# Patient Record
Sex: Male | Born: 1975 | Race: White | Hispanic: No | Marital: Single | Smoking: Never smoker
Health system: Southern US, Community
[De-identification: ages and names within clinical notes are randomized; demographics above are authoritative.]

## PROBLEM LIST (undated history)

## (undated) DIAGNOSIS — I10 Essential (primary) hypertension: Secondary | ICD-10-CM

## (undated) DIAGNOSIS — J45909 Unspecified asthma, uncomplicated: Secondary | ICD-10-CM

## (undated) DIAGNOSIS — R569 Unspecified convulsions: Secondary | ICD-10-CM

## (undated) HISTORY — DX: Essential (primary) hypertension: I10

## (undated) HISTORY — DX: Unspecified convulsions: R56.9

## (undated) HISTORY — PX: DENTAL SURGERY: SHX609

## (undated) HISTORY — DX: Unspecified asthma, uncomplicated: J45.909

---

## 2003-02-13 ENCOUNTER — Ambulatory Visit (HOSPITAL_COMMUNITY): Admission: RE | Admit: 2003-02-13 | Discharge: 2003-02-13 | Payer: Self-pay | Admitting: Neurology

## 2013-10-04 ENCOUNTER — Other Ambulatory Visit: Payer: Self-pay | Admitting: Family Medicine

## 2013-10-04 DIAGNOSIS — N50812 Left testicular pain: Secondary | ICD-10-CM

## 2013-10-04 DIAGNOSIS — N5089 Other specified disorders of the male genital organs: Secondary | ICD-10-CM

## 2013-10-08 ENCOUNTER — Other Ambulatory Visit: Payer: Self-pay

## 2013-10-11 ENCOUNTER — Ambulatory Visit
Admission: RE | Admit: 2013-10-11 | Discharge: 2013-10-11 | Disposition: A | Discharge status: Home / Self Care | Payer: BC Managed Care – PPO | Source: Ambulatory Visit | Attending: Family Medicine | Admitting: Family Medicine

## 2013-10-11 DIAGNOSIS — N50812 Left testicular pain: Secondary | ICD-10-CM

## 2013-10-11 DIAGNOSIS — N5089 Other specified disorders of the male genital organs: Secondary | ICD-10-CM

## 2015-05-07 ENCOUNTER — Other Ambulatory Visit: Payer: Self-pay | Admitting: Family Medicine

## 2015-05-07 DIAGNOSIS — Z139 Encounter for screening, unspecified: Secondary | ICD-10-CM

## 2015-05-07 DIAGNOSIS — R569 Unspecified convulsions: Secondary | ICD-10-CM

## 2015-05-08 ENCOUNTER — Encounter: Payer: Self-pay | Admitting: Neurology

## 2015-05-08 ENCOUNTER — Ambulatory Visit (INDEPENDENT_AMBULATORY_CARE_PROVIDER_SITE_OTHER): Payer: BLUE CROSS/BLUE SHIELD | Admitting: Neurology

## 2015-05-08 VITALS — BP 151/89 | HR 60 | Ht 69.0 in | Wt 190.6 lb

## 2015-05-08 DIAGNOSIS — G40009 Localization-related (focal) (partial) idiopathic epilepsy and epileptic syndromes with seizures of localized onset, not intractable, without status epilepticus: Secondary | ICD-10-CM

## 2015-05-08 DIAGNOSIS — R29818 Other symptoms and signs involving the nervous system: Secondary | ICD-10-CM | POA: Diagnosis not present

## 2015-05-08 DIAGNOSIS — G40909 Epilepsy, unspecified, not intractable, without status epilepticus: Secondary | ICD-10-CM | POA: Insufficient documentation

## 2015-05-08 NOTE — Progress Notes (Signed)
[facility] [address] [place]. [place] [address] [phone]       OFFICE CONSULT NOTE  Mr. Xsavier Seeley Date of Birth:  07-18-1976 Medical Record Number:  [id number]   Referring MD:  Bing Matter, PA-C  Reason for Referral:   seizure  HPI: Mr Cupp is a 39 year old male who had 2 recent episodes of possible seizures on August 29 and 31st. He states that he woke up on the floor the middle of the night and had to crawl back into his bed. He felt foggy and tired. He denied any injury, tongue bite or incontinence. The next day he had kind of like a hangover feeling. Marland Kitchen He states he has had similar episodes of possible seizures in February, March as well as April. He did have once seizure 12 years ago in 2004 and is on Dr. Jannifer Franklin. He remembers having had an MRI scan of the brain and EEG but I did not have any of these results. He was started on phenytoin which he took for a year and stopped. He apparently did not keep follow-up. Patient was seen recently in the ER for this 2 episodes and started back on Dilantin. On inquiry he also admits to transient episodes of having dj vu-like phenomena with some same dream lasting several minutes to half an hour occurring off and on for the last 1 year. Denies any significant head injury with loss of consciousness, prior history of this strokes, TIAs or history of childhood epilepsy. He's been taking Dilantin for the last 3 days and is tolerating it well without any side effects.  ROS:   14 system review of systems is positive for memory loss, seizure, allergies  PMH:  Past Medical History  Diagnosis Date  . Hypertension   . Seizures     Social History:  Social History   Social History  . Marital Status: Single    Spouse Name: N/A  . Number of Children: N/A  . Years of Education: 12   Occupational History  . [employer]     Social History Main Topics  . Smoking status: Never Smoker   . Smokeless  tobacco: Not on file  . Alcohol Use: No  . Drug Use: Yes    Special: Marijuana  . Sexual Activity: No   Other Topics Concern  . Not on file   Social History Narrative   Lives alone in a home    Drinks Coffee (1 pot) a day     Medications:   No current outpatient prescriptions on file prior to visit.   No current facility-administered medications on file prior to visit.    Allergies:   Allergies  Allergen Reactions  . Acetaminophen   . Levofloxacin   . Penicillins     Physical Exam General: well developed, well nourished young male, seated, in no evident distress Head: head normocephalic and atraumatic.   Neck: supple with no carotid or supraclavicular bruits Cardiovascular: regular rate and rhythm, no murmurs Musculoskeletal: no deformity Skin:  no rash/petichiae Vascular:  Normal pulses all extremities  Neurologic Exam Mental Status: Awake and fully alert. Oriented to place and time. Recent and remote memory intact. Attention span, concentration and fund of knowledge appropriate. Mood and affect appropriate.  Cranial Nerves: Fundoscopic exam reveals sharp disc margins. Pupils equal, briskly reactive to light. Extraocular movements full without nystagmus. Visual fields full to confrontation. Hearing intact. Facial sensation intact. Face, tongue, palate moves normally and symmetrically.  Motor: Normal bulk  and tone. Normal strength in all tested extremity muscles. Sensory.: intact to touch , pinprick , position and vibratory sensation.  Coordination: Rapid alternating movements normal in all extremities. Finger-to-nose and heel-to-shin performed accurately bilaterally. Gait and Station: Arises from chair without difficulty. Stance is normal. Gait demonstrates normal stride length and balance . Able to heel, toe and tandem walk without difficulty.  Reflexes: 1+ and symmetric. Toes downgoing.       ASSESSMENT: 20 year young male with 2 episodes of seizures in the last 1  week. He has remote history of seizures  In 2004 but had stopped anticonvulsants after a year.    PLAN: I had a long discussion with the patient with regards to his recent episodes of seizures which likely represent epilepsy given his remote history of seizures as well. I agree with continuing phenytoin 300 mg at night but check level in one week. Check EEG as well. I advised the patient to keep his appointment for MRI scan of the brain which is scheduled for 05/13/15. He was asked not to drive for the next 6 months as per [facility]. He was also asked to avoid seizure provoking eating stimuli like sleep deprivation, irregular eating or sleeping habits, alcohol or street drugs. He will return for follow-up in 2 months or call earlier if necessary. Antony Contras, MD Note: This document was prepared with digital dictation and possible smart phrase technology. Any transcriptional errors that result from this process are unintentional.

## 2015-05-08 NOTE — Patient Instructions (Signed)
I had a long discussion with the patient with regards to his recent episodes of seizures which likely represent epilepsy given his remote history of seizures as well. I agree with continuing phenytoin 300 mg at night but check level in one week. Check EEG as well. I advised the patient to keep his appointment for MRI scan of the brain which is scheduled for 05/13/15. He was asked not to drive for the next 6 months as per [facility]. He was also asked to avoid seizure provoking eating stimuli like sleep deprivation, irregular eating or sleeping habits, alcohol or street drugs. He will return for follow-up in 2 months or call earlier if necessary. Epilepsy Epilepsy is a disorder in which a person has repeated seizures over time. A seizure is a release of abnormal electrical activity in the brain. Seizures can cause a change in attention, behavior, or the ability to remain awake and alert (altered mental status). Seizures often involve uncontrollable shaking (convulsions).  Most people with epilepsy lead normal lives. However, people with epilepsy are at an increased risk of falls, accidents, and injuries. Therefore, it is important to begin treatment right away. CAUSES  Epilepsy has many possible causes. Anything that disturbs the normal pattern of brain cell activity can lead to seizures. This may include:   Head injury.  Birth trauma.  High fever as a child.  Stroke.  Bleeding into or around the brain.  Certain drugs.  Prolonged low oxygen, such as what occurs after CPR efforts.  Abnormal brain development.  Certain illnesses, such as meningitis, encephalitis (brain infection), malaria, and other infections.  An imbalance of nerve signaling chemicals (neurotransmitters).  SIGNS AND SYMPTOMS  The symptoms of a seizure can vary greatly from one person to another. Right before a seizure, you may have a warning (aura) that a seizure is about to occur. An aura may include the following  symptoms:  Fear or anxiety.  Nausea.  Feeling like the room is spinning (vertigo).  Vision changes, such as seeing flashing lights or spots. Common symptoms during a seizure include:  Abnormal sensations, such as an abnormal smell or a bitter taste in the mouth.   Sudden, general body stiffness.   Convulsions that involve rhythmic jerking of the face, arm, or leg on one or both sides.   Sudden change in consciousness.   Appearing to be awake but not responding.   Appearing to be asleep but cannot be awakened.   Grimacing, chewing, lip smacking, drooling, tongue biting, or loss of bowel or bladder control. After a seizure, you may feel sleepy for a while. DIAGNOSIS  Your health care provider will ask about your symptoms and take a medical history. Descriptions from any witnesses to your seizures will be very helpful in the diagnosis. A physical exam, including a detailed neurological exam, is necessary. Various tests may be done, such as:   An electroencephalogram (EEG). This is a painless test of your brain waves. In this test, a diagram is created of your brain waves. These diagrams can be interpreted by a specialist.  An MRI of the brain.   A CT scan of the brain.   A spinal tap (lumbar puncture, LP).  Blood tests to check for signs of infection or abnormal blood chemistry. TREATMENT  There is no cure for epilepsy, but it is generally treatable. Once epilepsy is diagnosed, it is important to begin treatment as soon as possible. For most people with epilepsy, seizures can be controlled with medicines.  The following may also be used:  A pacemaker for the brain (vagus nerve stimulator) can be used for people with seizures that are not well controlled by medicine.  Surgery on the brain. For some people, epilepsy eventually goes away. HOME CARE INSTRUCTIONS   Follow your health care provider's recommendations on driving and safety in normal activities.  Get  enough rest. Lack of sleep can cause seizures.  Only take over-the-counter or prescription medicines as directed by your health care provider. Take any prescribed medicine exactly as directed.  Avoid any known triggers of your seizures.  Keep a seizure diary. Record what you recall about any seizure, especially any possible trigger.   Make sure the people you live and work with know that you are prone to seizures. They should receive instructions on how to help you. In general, a witness to a seizure should:   Cushion your head and body.   Turn you on your side.   Avoid unnecessarily restraining you.   Not place anything inside your mouth.   Call for emergency medical help if there is any question about what has occurred.   Follow up with your health care provider as directed. You may need regular blood tests to monitor the levels of your medicine.  SEEK MEDICAL CARE IF:   You develop signs of infection or other illness. This might increase the risk of a seizure.   You seem to be having more frequent seizures.   Your seizure pattern is changing.  SEEK IMMEDIATE MEDICAL CARE IF:   You have a seizure that does not stop after a few moments.   You have a seizure that causes any difficulty in breathing.   You have a seizure that results in a very severe headache.   You have a seizure that leaves you with the inability to speak or use a part of your body.  Document Released: 08/23/2005 Document Revised: 06/13/2013 Document Reviewed: 04/04/2013 [facility] Patient Information 2015 [place], [place]. This information is not intended to replace advice given to you by your health care provider. Make sure you discuss any questions you have with your health care provider.

## 2015-05-13 ENCOUNTER — Ambulatory Visit
Admission: RE | Admit: 2015-05-13 | Discharge: 2015-05-13 | Disposition: A | Discharge status: Home / Self Care | Payer: BLUE CROSS/BLUE SHIELD | Source: Ambulatory Visit | Attending: Family Medicine | Admitting: Family Medicine

## 2015-05-13 DIAGNOSIS — R569 Unspecified convulsions: Secondary | ICD-10-CM

## 2015-05-13 DIAGNOSIS — Z139 Encounter for screening, unspecified: Secondary | ICD-10-CM

## 2015-05-13 MED ORDER — GADOBENATE DIMEGLUMINE 529 MG/ML IV SOLN
18.0000 mL | Freq: Once | INTRAVENOUS | Status: AC | PRN
  Administered 2015-05-13: 18 mL via INTRAVENOUS

## 2015-05-16 ENCOUNTER — Other Ambulatory Visit (INDEPENDENT_AMBULATORY_CARE_PROVIDER_SITE_OTHER): Payer: Self-pay

## 2015-05-16 DIAGNOSIS — Z0289 Encounter for other administrative examinations: Secondary | ICD-10-CM

## 2015-05-17 LAB — CBC
Hematocrit: 43 % (ref 37.5–51.0)
Hemoglobin: 14.6 g/dL (ref 12.6–17.7)
MCH: 31.8 pg (ref 26.6–33.0)
MCHC: 34 g/dL (ref 31.5–35.7)
MCV: 94 fL (ref 79–97)
PLATELETS: 422 10*3/uL — AB (ref 150–379)
RBC: 4.59 x10E6/uL (ref 4.14–5.80)
RDW: 12.4 % (ref 12.3–15.4)
WBC: 8.4 10*3/uL (ref 3.4–10.8)

## 2015-05-17 LAB — BASIC METABOLIC PANEL
BUN/Creatinine Ratio: 11 (ref 8–19)
BUN: 7 mg/dL (ref 6–20)
CALCIUM: 9.8 mg/dL (ref 8.7–10.2)
CHLORIDE: 98 mmol/L (ref 97–108)
CO2: 27 mmol/L (ref 18–29)
Creatinine, Ser: 0.63 mg/dL — ABNORMAL LOW (ref 0.76–1.27)
GFR calc Af Amer: 145 mL/min/{1.73_m2} (ref >59)
GFR calc non Af Amer: 125 mL/min/{1.73_m2} (ref >59)
GLUCOSE: 94 mg/dL (ref 65–99)
POTASSIUM: 5.1 mmol/L (ref 3.5–5.2)
Sodium: 142 mmol/L (ref 134–144)

## 2015-05-17 LAB — PHENYTOIN LEVEL, TOTAL: PHENYTOIN (DILANTIN), SERUM: 11.2 ug/mL (ref 10.0–20.0)

## 2015-05-19 ENCOUNTER — Telehealth: Payer: Self-pay | Admitting: Neurology

## 2015-05-19 NOTE — Telephone Encounter (Signed)
I called pt and he is taking phenytoin 300mg  po night.  He states been on for several weeks now.   No seizures.  Noted about 2-3 days after starting this medication, he started with a cough, on and off and worse at night.  Taking otc cough med helps for several hours.  He does have allergies in fall / winter.  Itchy / sinus issues.   He is not experiencing these now.  He does not remember having a problem with cough when he took phenytoin before.  What would you suggest?

## 2015-05-19 NOTE — Telephone Encounter (Signed)
Try changing phenytoin dose to morning time and see if it makes a difference. I however doubt phenytoin is to blame

## 2015-05-19 NOTE — Telephone Encounter (Signed)
Patient is calling in regard to Rx phenytoin 300 mg and states that he has developed a slight cough in the morning and an increased cough at night.  He would like a call back to discuss if this is a side effect of the medication and where to go from here.  Thanks!

## 2015-05-20 ENCOUNTER — Telehealth: Payer: Self-pay | Admitting: *Deleted

## 2015-05-20 NOTE — Telephone Encounter (Signed)
Pt returned call.  I relayed Dr. Clydene Fake message about try taking the phenytoin in am.  See if this makes difference.   Did relay that did not think phenytoin is the blame for cough.  He will call us back and let us know how he does.

## 2015-05-20 NOTE — Telephone Encounter (Signed)
I called and LMVM for pt on cell # to return call.

## 2015-05-20 NOTE — Telephone Encounter (Signed)
-----   Message from Garvin Fila, MD sent at 05/20/2015  1:03 PM EDT ----- Mitchell Heir inform the patient that Dilantin level and basic metabolic panel labs were normal. CBC was significant only for mildly elevated platelet count which is a finding of unclear significance

## 2015-05-20 NOTE — Telephone Encounter (Signed)
I spoke to pt and relayed lab results (normal except for mild elevated platelet count of unclear significance).  He asked about MRI and I deferred to Dr. Kathryne Eriksson or his associate about those results.

## 2015-05-27 ENCOUNTER — Ambulatory Visit (INDEPENDENT_AMBULATORY_CARE_PROVIDER_SITE_OTHER): Payer: BLUE CROSS/BLUE SHIELD | Admitting: Neurology

## 2015-05-27 DIAGNOSIS — G40009 Localization-related (focal) (partial) idiopathic epilepsy and epileptic syndromes with seizures of localized onset, not intractable, without status epilepticus: Secondary | ICD-10-CM | POA: Diagnosis not present

## 2015-05-27 NOTE — Procedures (Signed)
    History:  Jordan Norton is a 39 year old gentleman with a history of seizures, 2 recent events were noted on the 29th and 31st of August 2016. The patient has had seizures in February, March, and April of this year. The patient is being evaluated for seizures.  This is a routine EEG. No skull defects are noted. Medications include albuterol inhaler, Flonase, Claritin, Cozaar, and Dilantin.   EEG classification: Normal awake  Description of the recording: The background rhythms of this recording consists of a fairly well modulated medium amplitude alpha rhythm of 9 Hz that is reactive to eye opening and closure. As the record progresses, the patient appears to remain in the waking state throughout the recording. Photic stimulation was performed, resulting in a bilateral and symmetric photic driving response. Hyperventilation was also performed, resulting in a minimal buildup of the background rhythm activities without significant slowing seen. At no time during the recording does there appear to be evidence of spike or spike wave discharges or evidence of focal slowing. EKG monitor shows no evidence of cardiac rhythm abnormalities with a heart rate of 78.  Impression: This is a normal EEG recording in the waking state. No evidence of ictal or interictal discharges are seen.

## 2015-05-29 ENCOUNTER — Telehealth: Payer: Self-pay

## 2015-05-29 NOTE — Telephone Encounter (Signed)
Rn call patient and let him know the EEG study was normal. Patient was happy and verbalized understanding.

## 2015-05-29 NOTE — Telephone Encounter (Signed)
-----   Message from Garvin Fila, MD sent at 05/27/2015  6:05 PM EDT ----- Mitchell Heir inform patient that EEG study was normal

## 2015-07-17 ENCOUNTER — Encounter: Payer: Self-pay | Admitting: Neurology

## 2015-07-17 ENCOUNTER — Ambulatory Visit (INDEPENDENT_AMBULATORY_CARE_PROVIDER_SITE_OTHER): Payer: BLUE CROSS/BLUE SHIELD | Admitting: Neurology

## 2015-07-17 VITALS — BP 121/79 | HR 84 | Ht 70.0 in | Wt 187.2 lb

## 2015-07-17 DIAGNOSIS — R569 Unspecified convulsions: Secondary | ICD-10-CM

## 2015-07-17 NOTE — Patient Instructions (Addendum)
I had a long discussion with the patient with regards to his seizures, personally reviewed imaging studies, EEG and lab results and discussed them with the patient and answered questions. I recommend he continue phenytoin ER 300 mg daily. I discussed possible side effects with him and asked him to call me if needed. He was encouraged to be compliant with his medications and not to discontinue them abruptly or miss any dose and avoid seizure provoking triggers.. He was asked not to drive till 6 months from his last seizure. He will return for follow-up with me in 6 months or call earlier if necessary.

## 2015-07-17 NOTE — Progress Notes (Signed)
[facility] [address] [place]. [place] [address] [phone]       OFFICE FOLLOW UP VISIT NOTE  Jordan. Jordan Norton Date of Birth:  October 06, 1975 Medical Record Number:  [id number]   Referring MD:  Bing Matter, PA-C  Reason for Referral:   seizure  HPI: Jordan Norton is a 39 year old male who had 2 recent episodes of possible seizures on August 29 and 31st. He states that he woke up on the floor the middle of the night and had to crawl back into his bed. He felt foggy and tired. He denied any injury, tongue bite or incontinence. The next day he had kind of like a hangover feeling. Marland Kitchen He states he has had similar episodes of possible seizures in February, March as well as April. He did have once seizure 12 years ago in 2004 and is on Dr. Jannifer Franklin. He remembers having had an MRI scan of the brain and EEG but I did not have any of these results. He was started on phenytoin which he took for a year and stopped. He apparently did not keep follow-up. Patient was seen recently in the ER for this 2 episodes and started back on Dilantin. On inquiry he also admits to transient episodes of having dj vu-like phenomena with some same dream lasting several minutes to half an hour occurring off and on for the last 1 year. Denies any significant head injury with loss of consciousness, prior history of this strokes, TIAs or history of childhood epilepsy. He's been taking Dilantin for the last 3 days and is tolerating it well without any side effects. Update 07/17/2015 : He returns for follow-up to initial consultation visit 2 months ago. He is doing well without any recurrent seizures. His tolerating Dilantin ER 300 mg daily quite well. He does have occasional insomnia and feels tired during the day but is otherwise not having any dizziness, nausea, double vision or gait ataxia. He had EEG done in our office on 05/27/15 which I personally reviewed and was normal. MRI scan of the brain done on 05/13/15 is  unremarkable except for 2 tiny nonspecific white matter hyperintensities of unclear significance. Phenytoin level on 9/9 /16 was optimal at 11.2 mg percent. Patient has not been driving. He has no other new complaints. ROS:   14 system review of systems is positive for insomnia, daytime sleepiness, seizure, allergies and all other systems negative  PMH:  Past Medical History  Diagnosis Date  . Hypertension   . Seizures ([place])   . Asthma     Social History:  Social History   Social History  . Marital Status: Single    Spouse Name: N/A  . Number of Children: N/A  . Years of Education: 12   Occupational History  . [employer]     Social History Main Topics  . Smoking status: Never Smoker   . Smokeless tobacco: Not on file  . Alcohol Use: No  . Drug Use: Yes    Special: Marijuana  . Sexual Activity: No   Other Topics Concern  . Not on file   Social History Narrative   Lives alone in a home    Drinks Coffee (1 pot) a day     Medications:   Current Outpatient Prescriptions on File Prior to Visit  Medication Sig Dispense Refill  . albuterol (PROAIR HFA) 108 (90 BASE) MCG/ACT inhaler Inhale into the lungs.    . fluticasone (FLONASE) 50 MCG/ACT nasal spray Place into the  nose.    . fluticasone (FLOVENT HFA) 44 MCG/ACT inhaler Inhale into the lungs.    Marland Kitchen loratadine (CLARITIN) 10 MG tablet Take by mouth.    . losartan (COZAAR) 50 MG tablet Take by mouth.    . phenytoin (DILANTIN) 300 MG ER capsule Take by mouth.     No current facility-administered medications on file prior to visit.    Allergies:   Allergies  Allergen Reactions  . Acetaminophen   . Levofloxacin   . Penicillins     Physical Exam General: well developed, well nourished young Caucasian male, seated, in no evident distress Head: head normocephalic and atraumatic.   Neck: supple with no carotid or supraclavicular bruits Cardiovascular: regular rate and rhythm, no  murmurs Musculoskeletal: no deformity Skin:  no rash/petichiae Vascular:  Normal pulses all extremities  Neurologic Exam Mental Status: Awake and fully alert. Oriented to place and time. Recent and remote memory intact. Attention span, concentration and fund of knowledge appropriate. Mood and affect appropriate.  Cranial Nerves: Fundoscopic exam not done. Pupils equal, briskly reactive to light. Extraocular movements full without nystagmus. Visual fields full to confrontation. Hearing intact. Facial sensation intact. Face, tongue, palate moves normally and symmetrically.  Motor: Normal bulk and tone. Normal strength in all tested extremity muscles. Sensory.: intact to touch , pinprick , position and vibratory sensation.  Coordination: Rapid alternating movements normal in all extremities. Finger-to-nose and heel-to-shin performed accurately bilaterally. Gait and Station: Arises from chair without difficulty. Stance is normal. Gait demonstrates normal stride length and balance . Able to heel, toe and tandem walk without difficulty.  Reflexes: 1+ and symmetric. Toes downgoing.       ASSESSMENT: 23 year young male with 2 episodes of seizures in the last 1 week. He has remote history of seizures  In 2004 but had stopped anticonvulsants after a year.    PLAN: I had a long discussion with the patient with regards to his seizures, personally reviewed imaging studies, EEG and lab results and discussed them with the patient and answered questions. I recommend he continue phenytoin ER 300 mg daily. I discussed possible side effects with him and asked him to call me if needed. He was encouraged to be compliant with his medications and not to discontinue them abruptly or miss any dose and avoid seizure provoking triggers.. He was asked not to drive till 6 months from his last seizure. He will return for follow-up with me in 6 months or call earlier if necessary. Jordan Contras, MD Note: This document was  prepared with digital dictation and possible smart phrase technology. Any transcriptional errors that result from this process are unintentional.

## 2015-09-17 ENCOUNTER — Telehealth: Payer: Self-pay | Admitting: Neurology

## 2015-09-17 NOTE — Telephone Encounter (Signed)
Pt called said he had a seizure yesterday. It lasted for about 15 min - he bit his tongue a little bit, very rigid. He takes phenytoin (DILANTIN) 300 MG ER capsule on a regular bases since September 2016.  He was requesting an appt to see Dr Leonie Man but he booked into April. Please call and advise.

## 2015-09-17 NOTE — Telephone Encounter (Signed)
Rn call patient back about him having a seizure on 09-16-15 at work. Pt stated his co workers stated it lasted 15 minutes. Rn ask patient if he went to the ED, or call EMS. Pt stated he did not go to the ED and that was not a smart decision on his part. Pt already had an appt schedule for May for a 6 month follow up. Pt is taking dilantin 300mg  ER daily. Rn explain that Dr.Sethi can give him a call but the earliest is February 14,2017. Pt was schedule by Rn.

## 2015-09-18 ENCOUNTER — Other Ambulatory Visit: Payer: Self-pay | Admitting: Neurology

## 2015-09-18 DIAGNOSIS — R569 Unspecified convulsions: Secondary | ICD-10-CM

## 2015-09-18 NOTE — Telephone Encounter (Signed)
Recommend check dilantin level and keep appt in Feb

## 2015-09-19 ENCOUNTER — Other Ambulatory Visit (INDEPENDENT_AMBULATORY_CARE_PROVIDER_SITE_OTHER): Payer: Self-pay

## 2015-09-19 DIAGNOSIS — Z0289 Encounter for other administrative examinations: Secondary | ICD-10-CM

## 2015-09-19 DIAGNOSIS — R569 Unspecified convulsions: Secondary | ICD-10-CM

## 2015-09-19 NOTE — Telephone Encounter (Signed)
Rn call patient about getting his dilantin level drawn prior to October 21, 2015. Pt stated he is not driving due to the seizures, but will get someone to bring him over. Pt will notified once its done. Pt will try to come before [facility] close today or next week. Rn advised that Dr.Sethi wants him to keep his appt for 10/21/2015. Pt verbalized understanding.

## 2015-09-19 NOTE — Telephone Encounter (Signed)
LFt  Message for patient to come to [place] and have his dilantin blood level work done. Dr.Sehi will see pt on 2/14/.2017.

## 2015-09-19 NOTE — Telephone Encounter (Signed)
Pt called back requesting RN to call

## 2015-09-20 LAB — PHENYTOIN LEVEL, TOTAL: PHENYTOIN (DILANTIN), SERUM: 12.1 ug/mL (ref 10.0–20.0)

## 2015-09-20 LAB — PLEASE NOTE

## 2015-09-26 ENCOUNTER — Telehealth: Payer: Self-pay | Admitting: Neurology

## 2015-09-26 NOTE — Telephone Encounter (Signed)
Patient is calling to get lab results

## 2015-09-26 NOTE — Telephone Encounter (Signed)
LMTC./fim 

## 2015-09-29 ENCOUNTER — Telehealth: Payer: Self-pay | Admitting: Neurology

## 2015-09-29 NOTE — Telephone Encounter (Signed)
Rn call patients to give him dilantin level labs. Pt will still keep his appt in February 2017. Message will be sent to Dr.Sethi if he wants to make any adjustments to his meds or change them.

## 2015-09-29 NOTE — Telephone Encounter (Signed)
LFt vm for patient about lab results.

## 2015-09-29 NOTE — Telephone Encounter (Signed)
Patient is calling and would like infor on his lab results.  Please [phone].  Thanks!

## 2015-09-30 NOTE — Telephone Encounter (Signed)
Keep meds same and f/u appointment as scheduled

## 2015-10-01 NOTE — Telephone Encounter (Signed)
Rn call patient and stated Dr.Sethi wants him to keep the appt in February 14. Also his labs are good. Pt stated he has not had any new seizures since the beginning of January 2017.Pt verbalized understanding.

## 2015-10-01 NOTE — Telephone Encounter (Signed)
Rn call patient and dilantin levels are normal. Pt verbalized understanding.

## 2015-10-21 ENCOUNTER — Ambulatory Visit (INDEPENDENT_AMBULATORY_CARE_PROVIDER_SITE_OTHER): Payer: BLUE CROSS/BLUE SHIELD | Admitting: Neurology

## 2015-10-21 ENCOUNTER — Encounter: Payer: Self-pay | Admitting: Neurology

## 2015-10-21 VITALS — BP 160/91 | HR 88 | Ht 70.0 in | Wt 187.2 lb

## 2015-10-21 DIAGNOSIS — R569 Unspecified convulsions: Secondary | ICD-10-CM | POA: Diagnosis not present

## 2015-10-21 MED ORDER — LEVETIRACETAM ER 500 MG PO TB24: 500.0000 mg | ORAL_TABLET | Freq: Every day | ORAL | Status: DC

## 2015-10-21 NOTE — Patient Instructions (Addendum)
I had a long discussion with the patient regarding his recent breakthrough seizure despite optimal blood levels of Dilantin and hence I recommend adding Keppra XR 500 mg daily and to continue phenytoin in the current dose of  ER300 mg daily. I have discussed possible side effects with the patient and advised him to call me if needed. I again read in forced need to avoid seizure provoking stimuli like sleep deprivation, as stimulants and alcohol. We will check EEG for any silent seizure activity. He was again advised not to drive for 6 months as per [employer]. He will return for follow-up in 3 months with Ward Givens, nurse practitioner or call earlier if necessary.

## 2015-10-21 NOTE — Progress Notes (Signed)
[facility] [address] [place]. [place] [address] [phone]       OFFICE FOLLOW UP VISIT NOTE  Jordan. Jordan Norton Date of Birth:  1976-05-10 Medical Record Number:  [id number]   Referring MD:  Bing Matter, PA-C  Reason for Referral:   seizure  HPI: Jordan Norton is a 40 year old male who had 2 recent episodes of possible seizures on August 29 and 31st. He states that he woke up on the floor the middle of the night and had to crawl back into his bed. He felt foggy and tired. He denied any injury, tongue bite or incontinence. The next day he had kind of like a hangover feeling. Marland Kitchen He states he has had similar episodes of possible seizures in February, March as well as April. He did have once seizure 12 years ago in 2004 and is on Dr. Jannifer Franklin. He remembers having had an MRI scan of the brain and EEG but I did not have any of these results. He was started on phenytoin which he took for a year and stopped. He apparently did not keep follow-up. Patient was seen recently in the ER for this 2 episodes and started back on Dilantin. On inquiry he also admits to transient episodes of having dj vu-like phenomena with some same dream lasting several minutes to half an hour occurring off and on for the last 1 year. Denies any significant head injury with loss of consciousness, prior history of this strokes, TIAs or history of childhood epilepsy. He's been taking Dilantin for the last 3 days and is tolerating it well without any side effects. Update 07/17/2015 : He returns for follow-up to initial consultation visit 2 months ago. He is doing well without any recurrent seizures. His tolerating Dilantin ER 300 mg daily quite well. He does have occasional insomnia and feels tired during the day but is otherwise not having any dizziness, nausea, double vision or gait ataxia. He had EEG done in our office on 05/27/15 which I personally reviewed and was normal. MRI scan of the brain done on 05/13/15 is  unremarkable except for 2 tiny nonspecific white matter hyperintensities of unclear significance. Phenytoin level on 9/9 /16 was optimal at 11.2 mg percent. Patient has not been driving. He has no other new complaints. Update 10/21/2015 : He returns for follow-up after last visit 3 months ago. He had unprovoked seizure at work on 1/10 /2017. He states that his coworkers initially noted that he was staring and unresponsive with altered awareness for around 20 minutes before he fell down on the floor and had a witnessed generalized tonic-clonic seizure for about 3 minutes or so. Subsequently was tired and exhausted for a while and then returned back to baseline. He called the office and was advised to have his phenytoin level checked which was optimal at 12.1. His previous phenytoin level checked in September 2016 was also optimal at 11.1. Patient is unable to identify any specific triggers for the seizures and states that he has been quite compliant with his Dilantin dose. His tolerating it well without any side effects in the form of dizziness, tremors, double vision no dysarthria. He has no new complaints today. ROS:   14 system review of systems is positive for   seizure, allergies and all other systems negative  PMH:  Past Medical History  Diagnosis Date  . Hypertension   . Seizures ([place])   . Asthma     Social History:  Social History   Social  History  . Marital Status: Single    Spouse Name: N/A  . Number of Children: N/A  . Years of Education: 12   Occupational History  . [employer]     Social History Main Topics  . Smoking status: Never Smoker   . Smokeless tobacco: Not on file  . Alcohol Use: No  . Drug Use: Yes    Special: Marijuana  . Sexual Activity: No   Other Topics Concern  . Not on file   Social History Narrative   Lives alone in a home    Drinks Coffee (1 pot) a day     Medications:   Current Outpatient Prescriptions on File Prior to Visit    Medication Sig Dispense Refill  . albuterol (PROAIR HFA) 108 (90 BASE) MCG/ACT inhaler Inhale into the lungs.    . fluticasone (FLONASE) 50 MCG/ACT nasal spray Place into the nose.    . fluticasone (FLOVENT HFA) 44 MCG/ACT inhaler Inhale into the lungs.    Marland Kitchen loratadine (CLARITIN) 10 MG tablet Take by mouth.    . losartan (COZAAR) 50 MG tablet Take by mouth.    . pantoprazole (PROTONIX) 40 MG tablet Take 40 mg by mouth daily.  3  . phenytoin (DILANTIN) 300 MG ER capsule Take by mouth.     No current facility-administered medications on file prior to visit.    Allergies:   Allergies  Allergen Reactions  . Eggs Or Egg-Derived Products     Asthma or hives or rash  . Acetaminophen   . Levofloxacin   . Penicillins     Physical Exam General: well developed, well nourished young Caucasian male, seated, in no evident distress Head: head normocephalic and atraumatic.   Neck: supple with no carotid or supraclavicular bruits Cardiovascular: regular rate and rhythm, no murmurs Musculoskeletal: no deformity Skin:  no rash/petichiae Vascular:  Normal pulses all extremities  Neurologic Exam Mental Status: Awake and fully alert. Oriented to place and time. Recent and remote memory intact. Attention span, concentration and fund of knowledge appropriate. Mood and affect appropriate.  Cranial Nerves: Fundoscopic exam not done. Pupils equal, briskly reactive to light. Extraocular movements full without nystagmus. Visual fields full to confrontation. Hearing intact. Facial sensation intact. Face, tongue, palate moves normally and symmetrically.  Motor: Normal bulk and tone. Normal strength in all tested extremity muscles. Sensory.: intact to touch , pinprick , position and vibratory sensation.  Coordination: Rapid alternating movements normal in all extremities. Finger-to-nose and heel-to-shin performed accurately bilaterally. Gait and Station: Arises from chair without difficulty. Stance is normal.  Gait demonstrates normal stride length and balance . Able to heel, toe and tandem walk without difficulty.  Reflexes: 1+ and symmetric. Toes downgoing.       ASSESSMENT: 71 year young male with partial onset seizures with secondary generalization who is  having breakthrough seizures on Dilantin. He has remote history of seizures  In 2004 but had stopped anticonvulsants after a year. Recent breakthrough seizure in January 2017 despite optimal levels of phenytoin and no obvious provoking factor    PLAN: I had a long discussion with the patient regarding his recent breakthrough seizure despite optimal blood levels of Dilantin and hence I recommend adding Keppra XR 500 mg daily and to continue phenytoin in the current dose of  ER300 mg daily. I have discussed possible side effects with the patient and advised him to call me if needed. I again read in forced need to avoid seizure provoking stimuli like sleep  deprivation, as stimulants and alcohol. We will check EEG for any silent seizure activity. He was again advised not to drive for 6 months as per [facility]. He will return for follow-up in 3 months with Ward Givens, nurse practitioner or call earlier if necessary. Antony Contras, MD Note: This document was prepared with digital dictation and possible smart phrase technology. Any transcriptional errors that result from this process are unintentional.

## 2015-11-12 ENCOUNTER — Ambulatory Visit (INDEPENDENT_AMBULATORY_CARE_PROVIDER_SITE_OTHER): Payer: BLUE CROSS/BLUE SHIELD | Admitting: Neurology

## 2015-11-12 DIAGNOSIS — R569 Unspecified convulsions: Secondary | ICD-10-CM | POA: Diagnosis not present

## 2015-11-12 NOTE — Procedures (Signed)
    History:  Jordan Norton is a 40 year old gentleman with history of seizures, he has had 2 seizures in August 2016, and he had another seizure on 09/16/2015. The episode was associated with staring and unresponsiveness lasting up to 20 minutes. He then fell on the floor with generalized tonic-clonic seizures lasting about 3 minutes. He is being evaluated for seizures.  This is a routine EEG. No skull defects are noted. Medications include albuterol, Flonase, Keppra, Claritin, Cozaar, Protonix, and Dilantin.   EEG classification: Normal awake  Description of the recording: The background rhythms of this recording consists of a fairly well modulated medium amplitude alpha rhythm of 10 Hz that is reactive to eye opening and closure. As the record progresses, the patient appears to remain in the waking state throughout the recording. Photic stimulation was performed, resulting in a bilateral and symmetric photic driving response. Hyperventilation was also performed, resulting in a minimal buildup of the background rhythm activities without significant slowing seen. At no time during the recording does there appear to be evidence of spike or spike wave discharges or evidence of focal slowing. EKG monitor shows no evidence of cardiac rhythm abnormalities with a heart rate of 72.  Impression: This is a normal EEG recording in the waking state. No evidence of ictal or interictal discharges are seen.

## 2016-01-16 ENCOUNTER — Ambulatory Visit: Payer: BLUE CROSS/BLUE SHIELD | Admitting: Neurology

## 2016-01-20 ENCOUNTER — Encounter: Payer: Self-pay | Admitting: Adult Health

## 2016-01-20 ENCOUNTER — Ambulatory Visit (INDEPENDENT_AMBULATORY_CARE_PROVIDER_SITE_OTHER): Payer: BLUE CROSS/BLUE SHIELD | Admitting: Adult Health

## 2016-01-20 VITALS — BP 134/86 | HR 87 | Ht 70.0 in | Wt 191.4 lb

## 2016-01-20 DIAGNOSIS — Z5181 Encounter for therapeutic drug level monitoring: Secondary | ICD-10-CM

## 2016-01-20 DIAGNOSIS — R569 Unspecified convulsions: Secondary | ICD-10-CM | POA: Diagnosis not present

## 2016-01-20 MED ORDER — LEVETIRACETAM ER 500 MG PO TB24: 500.0000 mg | ORAL_TABLET | Freq: Every day | ORAL | Status: DC

## 2016-01-20 MED ORDER — PHENYTOIN SODIUM EXTENDED 300 MG PO CAPS: 300.0000 mg | ORAL_CAPSULE | Freq: Every day | ORAL | Status: DC

## 2016-01-20 NOTE — Progress Notes (Signed)
I have reviewed and agreed above plan. 

## 2016-01-20 NOTE — Progress Notes (Signed)
PATIENT: Jordan Norton DOB: Apr 30, 1976  REASON FOR VISIT: follow up- seizures HISTORY FROM: patient  HISTORY OF PRESENT ILLNESS: Jordan Norton is a 40 year old male with a history of seizures. He returns today for follow-up. He is currently on Dilantin 100 mg daily. At the last visit he was started on Keppra extended release 500 mg daily. He reports that he has not had any additional seizure events. He reports that he is tolerating Keppra well. He states that he has noticed he gets irritated easily however none of his friends or family have noticed this. He is not operating a motor vehicle. He continues to work. He lives at home alone. He is able to complete all ADLs independently. He returns today for an evaluation.  HISTORY 10/21/15: Jordan Norton is a 40 year old male who had 2 recent episodes of possible seizures on August 29 and 31st. He states that he woke up on the floor the middle of the night and had to crawl back into his bed. He felt foggy and tired. He denied any injury, tongue bite or incontinence. The next day he had kind of like a hangover feeling. Marland Kitchen He states he has had similar episodes of possible seizures in February, March as well as April. He did have once seizure 12 years ago in 2004 and is on Dr. Jannifer Franklin. He remembers having had an MRI scan of the brain and EEG but I did not have any of these results. He was started on phenytoin which he took for a year and stopped. He apparently did not keep follow-up. Patient was seen recently in the ER for this 2 episodes and started back on Dilantin. On inquiry he also admits to transient episodes of having dj vu-like phenomena with some same dream lasting several minutes to half an hour occurring off and on for the last 1 year. Denies any significant head injury with loss of consciousness, prior history of this strokes, TIAs or history of childhood epilepsy. He's been taking Dilantin for the last 3 days and is tolerating it well without any side  effects. Update 07/17/2015 : He returns for follow-up to initial consultation visit 2 months ago. He is doing well without any recurrent seizures. His tolerating Dilantin ER 300 mg daily quite well. He does have occasional insomnia and feels tired during the day but is otherwise not having any dizziness, nausea, double vision or gait ataxia. He had EEG done in our office on 05/27/15 which I personally reviewed and was normal. MRI scan of the brain done on 05/13/15 is unremarkable except for 2 tiny nonspecific white matter hyperintensities of unclear significance. Phenytoin level on 9/9 /16 was optimal at 11.2 mg percent. Patient has not been driving. He has no other new complaints. Update 10/21/2015 : He returns for follow-up after last visit 3 months ago. He had unprovoked seizure at work on 1/10 /2017. He states that his coworkers initially noted that he was staring and unresponsive with altered awareness for around 20 minutes before he fell down on the floor and had a witnessed generalized tonic-clonic seizure for about 3 minutes or so. Subsequently was tired and exhausted for a while and then returned back to baseline. He called the office and was advised to have his phenytoin level checked which was optimal at 12.1. His previous phenytoin level checked in September 2016 was also optimal at 11.1. Patient is unable to identify any specific triggers for the seizures and states that he has been quite compliant with his  Dilantin dose. His tolerating it well without any side effects in the form of dizziness, tremors, double vision no dysarthria. He has no new complaints today  REVIEW OF SYSTEMS: Out of a complete 14 system review of symptoms, the patient complains only of the following symptoms, and all other reviewed systems are negative.  See history of present illness  ALLERGIES: Allergies  Allergen Reactions  . Eggs Or Egg-Derived Products     Asthma or hives or rash  . Acetaminophen   . Levofloxacin     . Penicillins     HOME MEDICATIONS: Outpatient Prescriptions Prior to Visit  Medication Sig Dispense Refill  . albuterol (PROAIR HFA) 108 (90 BASE) MCG/ACT inhaler Inhale into the lungs.    . fluticasone (FLONASE) 50 MCG/ACT nasal spray Place into the nose.    . fluticasone (FLOVENT HFA) 44 MCG/ACT inhaler Inhale into the lungs.    Marland Kitchen loratadine (CLARITIN) 10 MG tablet Take by mouth.    . losartan (COZAAR) 50 MG tablet Take by mouth.    . pantoprazole (PROTONIX) 40 MG tablet Take 40 mg by mouth daily.  3  . levETIRAcetam (KEPPRA XR) 500 MG 24 hr tablet Take 1 tablet (500 mg total) by mouth daily. 90 tablet 1  . phenytoin (DILANTIN) 300 MG ER capsule Take by mouth.     No facility-administered medications prior to visit.    PAST MEDICAL HISTORY: Past Medical History  Diagnosis Date  . Hypertension   . Seizures ([place])   . Asthma     PAST SURGICAL HISTORY: History reviewed. No pertinent past surgical history.  FAMILY HISTORY: Family History  Problem Relation Age of Onset  . Parkinsonism Mother   . Cancer Mother   . Hypertension Father   . Cancer Maternal Uncle   . Parkinsonism Maternal Grandmother   . Heart disease Maternal Grandfather   . Diabetes Paternal Grandmother     SOCIAL HISTORY: Social History   Social History  . Marital Status: Single    Spouse Name: N/A  . Number of Children: N/A  . Years of Education: 12   Occupational History  . [employer]     Social History Main Topics  . Smoking status: Never Smoker   . Smokeless tobacco: Not on file  . Alcohol Use: No  . Drug Use: Yes    Special: Marijuana  . Sexual Activity: No   Other Topics Concern  . Not on file   Social History Narrative   Lives alone in a home    Drinks Coffee (1 pot) a day       PHYSICAL EXAM  Filed Vitals:   01/20/16 0830  BP: 134/86  Pulse: 87  Height: 5\' 10"  (1.778 m)  Weight: 191 lb 6.4 oz (86.818 kg)   Body mass index is 27.46  kg/(m^2).  Generalized: Well developed, in no acute distress   Neurological examination  Mentation: Alert oriented to time, place, history taking. Follows all commands speech and language fluent Cranial nerve II-XII: Pupils were equal round reactive to light. Extraocular movements were full, visual field were full on confrontational test. Facial sensation and strength were normal. Uvula tongue midline. Head turning and shoulder shrug  were normal and symmetric. Motor: The motor testing reveals 5 over 5 strength of all 4 extremities. Good symmetric motor tone is noted throughout.  Sensory: Sensory testing is intact to soft touch on all 4 extremities. No evidence of extinction is noted.  Coordination: Cerebellar testing reveals good finger-nose-finger and  heel-to-shin bilaterally.  Gait and station: Gait is normal. Tandem gait is normal. Romberg is negative. No drift is seen.  Reflexes: Deep tendon reflexes are symmetric and normal bilaterally.   DIAGNOSTIC DATA (LABS, IMAGING, TESTING) - I reviewed patient records, labs, notes, testing and imaging myself where available.  Lab Results  Component Value Date   WBC 8.4 05/16/2015   HCT 43.0 05/16/2015   MCV 94 05/16/2015   PLT 422* 05/16/2015      Component Value Date/Time   NA 142 05/16/2015 0808   K 5.1 05/16/2015 0808   CL 98 05/16/2015 0808   CO2 27 05/16/2015 0808   GLUCOSE 94 05/16/2015 0808   BUN 7 05/16/2015 0808   CREATININE 0.63* 05/16/2015 0808   CALCIUM 9.8 05/16/2015 0808   GFRNONAA 125 05/16/2015 0808   GFRAA 145 05/16/2015 0808     ASSESSMENT AND PLAN 40 y.o. year old male  has a past medical history of Hypertension; Seizures ([place]); and Asthma. here with:  1. Seizures  Overall the patient is doing well. He will continue on Dilantin 300 mg and Keppra xr 500 mg daily. I will check blood work today. However this will not be a trough level. Patient advised that if he has any additional seizures he should let us know.  He should not operate a motor vehicle until he has been seizure-free for 6 months. He will follow-up in 3-4 months or sooner if needed.   Ward Givens, MSN, NP-C 01/20/2016, 9:03 AM [facility] [address], [place], [place] [address] [phone]

## 2016-01-20 NOTE — Patient Instructions (Signed)
Continue Keppra and Dilantin Blood work today If you have any seizure events please let us know.   

## 2016-01-21 LAB — CBC WITH DIFFERENTIAL/PLATELET
BASOS ABS: 0 10*3/uL (ref 0.0–0.2)
Basos: 0 %
EOS (ABSOLUTE): 0.1 10*3/uL (ref 0.0–0.4)
Eos: 2 %
HEMOGLOBIN: 15.8 g/dL (ref 12.6–17.7)
Hematocrit: 47.1 % (ref 37.5–51.0)
Immature Grans (Abs): 0 10*3/uL (ref 0.0–0.1)
Immature Granulocytes: 0 %
LYMPHS ABS: 1.4 10*3/uL (ref 0.7–3.1)
Lymphs: 25 %
MCH: 31.3 pg (ref 26.6–33.0)
MCHC: 33.5 g/dL (ref 31.5–35.7)
MCV: 94 fL (ref 79–97)
MONOCYTES: 7 %
Monocytes Absolute: 0.4 10*3/uL (ref 0.1–0.9)
Neutrophils Absolute: 3.7 10*3/uL (ref 1.4–7.0)
Neutrophils: 66 %
PLATELETS: 332 10*3/uL (ref 150–379)
RBC: 5.04 x10E6/uL (ref 4.14–5.80)
RDW: 13.2 % (ref 12.3–15.4)
WBC: 5.7 10*3/uL (ref 3.4–10.8)

## 2016-01-21 LAB — COMPREHENSIVE METABOLIC PANEL
ALBUMIN: 4.7 g/dL (ref 3.5–5.5)
ALK PHOS: 81 IU/L (ref 39–117)
ALT: 33 IU/L (ref 0–44)
AST: 22 IU/L (ref 0–40)
Albumin/Globulin Ratio: 2 (ref 1.2–2.2)
BILIRUBIN TOTAL: 0.2 mg/dL (ref 0.0–1.2)
BUN / CREAT RATIO: 10 (ref 9–20)
BUN: 7 mg/dL (ref 6–20)
CHLORIDE: 101 mmol/L (ref 96–106)
CO2: 28 mmol/L (ref 18–29)
Calcium: 9.6 mg/dL (ref 8.7–10.2)
Creatinine, Ser: 0.73 mg/dL — ABNORMAL LOW (ref 0.76–1.27)
GFR calc Af Amer: 135 mL/min/{1.73_m2} (ref >59)
GFR calc non Af Amer: 117 mL/min/{1.73_m2} (ref >59)
GLUCOSE: 98 mg/dL (ref 65–99)
Globulin, Total: 2.3 g/dL (ref 1.5–4.5)
Potassium: 4.5 mmol/L (ref 3.5–5.2)
Sodium: 146 mmol/L — ABNORMAL HIGH (ref 134–144)
Total Protein: 7 g/dL (ref 6.0–8.5)

## 2016-01-21 LAB — PHENYTOIN LEVEL, TOTAL: Phenytoin (Dilantin), Serum: 13.3 ug/mL (ref 10.0–20.0)

## 2016-04-22 ENCOUNTER — Encounter: Payer: Self-pay | Admitting: Neurology

## 2016-04-22 ENCOUNTER — Ambulatory Visit (INDEPENDENT_AMBULATORY_CARE_PROVIDER_SITE_OTHER): Payer: BLUE CROSS/BLUE SHIELD | Admitting: Neurology

## 2016-04-22 VITALS — BP 122/78 | HR 86 | Ht 70.0 in | Wt 191.0 lb

## 2016-04-22 DIAGNOSIS — G40209 Localization-related (focal) (partial) symptomatic epilepsy and epileptic syndromes with complex partial seizures, not intractable, without status epilepticus: Secondary | ICD-10-CM | POA: Diagnosis not present

## 2016-04-22 NOTE — Patient Instructions (Signed)
I had a long discussion with the patient regarding his partial complex seizures and recommend he continue the current dose of Dilantin 300 mg a day and Keppra XR 500 mg daily. I stressed the need to be compliant with his medications and not miss any dosages. Also advised him to avoid seizure provoking triggers like sleep deprivation, stimulants and alcohol. He will return for follow-up in 6 months with Ward Givens, nurse practitioner call earlier if necessary.

## 2016-04-22 NOTE — Progress Notes (Signed)
PATIENT: Jordan Norton DOB: 1976-06-22  REASON FOR VISIT: follow up- seizures HISTORY FROM: patient  HISTORY OF PRESENT ILLNESS: Jordan Norton is a 40 year old male with a history of seizures. He returns today for follow-up. He is currently on Dilantin 100 mg daily. At the last visit he was started on Keppra extended release 500 mg daily. He reports that he has not had any additional seizure events. He reports that he is tolerating Keppra well. He states that he has noticed he gets irritated easily however none of his friends or family have noticed this. He is not operating a motor vehicle. He continues to work. He lives at home alone. He is able to complete all ADLs independently. He returns today for an evaluation.  HISTORY 10/21/15: Jordan Norton is a 41 year old male who had 2 recent episodes of possible seizures on August 29 and 31st. He states that he woke up on the floor the middle of the night and had to crawl back into his bed. He felt foggy and tired. He denied any injury, tongue bite or incontinence. The next day he had kind of like a hangover feeling. Marland Kitchen He states he has had similar episodes of possible seizures in February, March as well as April. He did have once seizure 12 years ago in 2004 and is on Dr. Jannifer Franklin. He remembers having had an MRI scan of the brain and EEG but I did not have any of these results. He was started on phenytoin which he took for a year and stopped. He apparently did not keep follow-up. Patient was seen recently in the ER for this 2 episodes and started back on Dilantin. On inquiry he also admits to transient episodes of having dj vu-like phenomena with some same dream lasting several minutes to half an hour occurring off and on for the last 1 year. Denies any significant head injury with loss of consciousness, prior history of this strokes, TIAs or history of childhood epilepsy. He's been taking Dilantin for the last 3 days and is tolerating it well without any side  effects. Update 07/17/2015 : He returns for follow-up to initial consultation visit 2 months ago. He is doing well without any recurrent seizures. His tolerating Dilantin ER 300 mg daily quite well. He does have occasional insomnia and feels tired during the day but is otherwise not having any dizziness, nausea, double vision or gait ataxia. He had EEG done in our office on 05/27/15 which I personally reviewed and was normal. MRI scan of the brain done on 05/13/15 is unremarkable except for 2 tiny nonspecific white matter hyperintensities of unclear significance. Phenytoin level on 9/9 /16 was optimal at 11.2 mg percent. Patient has not been driving. He has no other new complaints. Update 10/21/2015 : He returns for follow-up after last visit 3 months ago. He had unprovoked seizure at work on 1/10 /2017. He states that his coworkers initially noted that he was staring and unresponsive with altered awareness for around 20 minutes before he fell down on the floor and had a witnessed generalized tonic-clonic seizure for about 3 minutes or so. Subsequently was tired and exhausted for a while and then returned back to baseline. He called the office and was advised to have his phenytoin level checked which was optimal at 12.1. His previous phenytoin level checked in September 2016 was also optimal at 11.1. Patient is unable to identify any specific triggers for the seizures and states that he has been quite compliant with his  Dilantin dose. His tolerating it well without any side effects in the form of dizziness, tremors, double vision no dysarthria. He has no new complaints today Update 04/22/2016 : He returns for follow-up after last visit 3 months ago. He states is doing well and has not had any recurrent seizures now since January 2017. He is tolerating Dilantin 300 mg well without side effects.Dilantin level on 01/20/16 was optimal at 13.3 mg percent. He also takes Keppra XR 500 daily and states his been compliant and  not missed a single dose. He started driving back and has had no problems. His tolerating both medications without any side effects. He has not had any new health issues and has no complaints today. REVIEW OF SYSTEMS: Out of a complete 14 system review of symptoms, the patient complains only of the following symptoms, and all other reviewed systems are negative. No complaints today See history of present illness  ALLERGIES: Allergies  Allergen Reactions  . Eggs Or Egg-Derived Products     Asthma or hives or rash  . Acetaminophen   . Hydrocodone-Acetaminophen Other (See Comments)    unknown  . Levofloxacin   . Penicillins     HOME MEDICATIONS: Outpatient Medications Prior to Visit  Medication Sig Dispense Refill  . albuterol (PROAIR HFA) 108 (90 BASE) MCG/ACT inhaler Inhale into the lungs.    . fluticasone (FLONASE) 50 MCG/ACT nasal spray Place into the nose.    . fluticasone (FLOVENT HFA) 44 MCG/ACT inhaler Inhale into the lungs.    . levETIRAcetam (KEPPRA XR) 500 MG 24 hr tablet Take 1 tablet (500 mg total) by mouth daily. 90 tablet 3  . loratadine (CLARITIN) 10 MG tablet Take by mouth.    . losartan (COZAAR) 50 MG tablet Take by mouth.    . pantoprazole (PROTONIX) 40 MG tablet Take 40 mg by mouth daily.  3  . phenytoin (DILANTIN) 300 MG ER capsule Take 1 capsule (300 mg total) by mouth daily. 90 capsule 3   No facility-administered medications prior to visit.     PAST MEDICAL HISTORY: Past Medical History:  Diagnosis Date  . Asthma   . Hypertension   . Seizures ([place])     PAST SURGICAL HISTORY: History reviewed. No pertinent surgical history.  FAMILY HISTORY: Family History  Problem Relation Age of Onset  . Parkinsonism Mother   . Cancer Mother   . Hypertension Father   . Cancer Maternal Uncle   . Parkinsonism Maternal Grandmother   . Heart disease Maternal Grandfather   . Diabetes Paternal Grandmother     SOCIAL HISTORY: Social History   Social History  .  Marital status: Single    Spouse name: N/A  . Number of children: N/A  . Years of education: 57   Occupational History  . [employer]     Social History Main Topics  . Smoking status: Never Smoker  . Smokeless tobacco: Never Used  . Alcohol use No  . Drug use:     Types: Marijuana  . Sexual activity: No   Other Topics Concern  . Not on file   Social History Narrative   Lives alone in a home    Drinks Coffee (1 pot) a day       PHYSICAL EXAM  Vitals:   04/22/16 0827  BP: 122/78  Pulse: 86  Weight: 191 lb (86.6 kg)  Height: 5\' 10"  (1.778 m)   Body mass index is 27.41 kg/m.  Generalized: Pleasant young Caucasian male in  no acute distress   Neurological examination  Mentation: Alert oriented to time, place, history taking. Follows all commands speech and language fluent Cranial nerve II-XII: Pupils were equal round reactive to light. Extraocular movements were full, visual field were full on confrontational test. Facial sensation and strength were normal. Uvula tongue midline. Head turning and shoulder shrug  were normal and symmetric. Motor: The motor testing reveals 5 over 5 strength of all 4 extremities. Good symmetric motor tone is noted throughout.  Sensory: Sensory testing is intact to soft touch on all 4 extremities. No evidence of extinction is noted.  Coordination: Cerebellar testing reveals good finger-nose-finger and heel-to-shin bilaterally.  Gait and station: Gait is normal. Tandem gait is normal. Romberg is negative. No drift is seen.  Reflexes: Deep tendon reflexes are symmetric and normal bilaterally.   DIAGNOSTIC DATA (LABS, IMAGING, TESTING) - I reviewed patient records, labs, notes, testing and imaging myself where available.  Lab Results  Component Value Date   WBC 5.7 01/20/2016   HCT 47.1 01/20/2016   MCV 94 01/20/2016   PLT 332 01/20/2016      Component Value Date/Time   NA 146 (H) 01/20/2016 0903   K 4.5 01/20/2016 0903     CL 101 01/20/2016 0903   CO2 28 01/20/2016 0903   GLUCOSE 98 01/20/2016 0903   BUN 7 01/20/2016 0903   CREATININE 0.73 (L) 01/20/2016 0903   CALCIUM 9.6 01/20/2016 0903   PROT 7.0 01/20/2016 0903   ALBUMIN 4.7 01/20/2016 0903   AST 22 01/20/2016 0903   ALT 33 01/20/2016 0903   ALKPHOS 81 01/20/2016 0903   BILITOT 0.2 01/20/2016 0903   GFRNONAA 117 01/20/2016 0903   GFRAA 135 01/20/2016 0903     ASSESSMENT AND PLAN 40 y.o. year old male  has a past medical history of Asthma; Hypertension; and Seizures ([place]). here with:  1.Partial complex epilepsy I had a long discussion with the patient regarding his partial complex seizures and recommend he continue the current dose of Dilantin 300 mg a day and Keppra XR 500 mg daily. I stressed the need to be compliant with his medications and not miss any dosages. Also advised him to avoid seizure provoking triggers like sleep deprivation, stimulants and alcohol. Greater than 50% time in this 25 minute visit was spent in counseling and coordination of care about seizures. He will return for follow-up in 6 months with Ward Givens, nurse practitioner call earlier if necessary.   04/22/2016, 8:49 AM [facility] [address], [place], [place] [address] [phone]

## 2016-10-26 ENCOUNTER — Encounter: Payer: Self-pay | Admitting: Adult Health

## 2016-10-26 ENCOUNTER — Ambulatory Visit (INDEPENDENT_AMBULATORY_CARE_PROVIDER_SITE_OTHER): Payer: BLUE CROSS/BLUE SHIELD | Admitting: Adult Health

## 2016-10-26 VITALS — BP 135/80 | HR 72 | Ht 70.0 in | Wt 195.8 lb

## 2016-10-26 DIAGNOSIS — Z5181 Encounter for therapeutic drug level monitoring: Secondary | ICD-10-CM | POA: Diagnosis not present

## 2016-10-26 DIAGNOSIS — R569 Unspecified convulsions: Secondary | ICD-10-CM

## 2016-10-26 NOTE — Progress Notes (Signed)
I have reviewed and agreed above plan. 

## 2016-10-26 NOTE — Patient Instructions (Signed)
Continue Keppra and Dilantin Blood work today If your symptoms worsen or you develop new symptoms please let us know.

## 2016-10-26 NOTE — Progress Notes (Signed)
PATIENT: Jordan Norton DOB: December 01, 1975  REASON FOR VISIT: follow up- seizures HISTORY FROM: patient  HISTORY OF PRESENT ILLNESS: Jordan Norton is a 41 year old male with a history of seizures. He returns today for follow-up. He is currently on Dilantin and Keppra. He reports that he has not had any additional seizures. Denies any changes with his gait or balance. He does operate a Teacher, music. Denies any changes with his mood or behavior. States that he is sleeping well. He is able to complete all ADLs independently. He works full-time for a [employer], [employer]. He returns today for an evaluation.  HISTORY 01/21/16: Jordan Norton is a 41 year old male with a history of seizures. He returns today for follow-up. He is currently on Dilantin 100 mg daily. At the last visit he was started on Keppra extended release 500 mg daily. He reports that he has not had any additional seizure events. He reports that he is tolerating Keppra well. He states that he has noticed he gets irritated easily however none of his friends or family have noticed this. He is not operating a motor vehicle. He continues to work. He lives at home alone. He is able to complete all ADLs independently. He returns today for an evaluation.  REVIEW OF SYSTEMS: Out of a complete 14 system review of symptoms, the patient complains only of the following symptoms, and all other reviewed systems are negative.  See history of present illness  ALLERGIES: Allergies  Allergen Reactions  . Eggs Or Egg-Derived Products     Asthma or hives or rash  . Acetaminophen   . Hydrocodone-Acetaminophen Other (See Comments)    unknown  . Levofloxacin   . Penicillins     HOME MEDICATIONS: Outpatient Medications Prior to Visit  Medication Sig Dispense Refill  . albuterol (PROAIR HFA) 108 (90 BASE) MCG/ACT inhaler Inhale into the lungs.    . fluticasone (FLONASE) 50 MCG/ACT nasal spray Place into the nose.    . fluticasone (FLOVENT HFA) 44 MCG/ACT  inhaler Inhale into the lungs.    . levETIRAcetam (KEPPRA XR) 500 MG 24 hr tablet Take 1 tablet (500 mg total) by mouth daily. 90 tablet 3  . loratadine (CLARITIN) 10 MG tablet Take by mouth.    . losartan (COZAAR) 50 MG tablet Take by mouth.    . pantoprazole (PROTONIX) 40 MG tablet Take 40 mg by mouth daily.  3  . phenytoin (DILANTIN) 300 MG ER capsule Take 1 capsule (300 mg total) by mouth daily. 90 capsule 3   No facility-administered medications prior to visit.     PAST MEDICAL HISTORY: Past Medical History:  Diagnosis Date  . Asthma   . Hypertension   . Seizures ([place])     PAST SURGICAL HISTORY: History reviewed. No pertinent surgical history.  FAMILY HISTORY: Family History  Problem Relation Age of Onset  . Parkinsonism Mother   . Cancer Mother   . Hypertension Father   . Cancer Maternal Uncle   . Parkinsonism Maternal Grandmother   . Heart disease Maternal Grandfather   . Diabetes Paternal Grandmother     SOCIAL HISTORY: Social History   Social History  . Marital status: Single    Spouse name: N/A  . Number of children: N/A  . Years of education: 59   Occupational History  . [employer]     Social History Main Topics  . Smoking status: Never Smoker  . Smokeless tobacco: Never Used  . Alcohol use No  . Drug  use: Yes    Types: Marijuana  . Sexual activity: No   Other Topics Concern  . Not on file   Social History Narrative   Lives alone in a home    Drinks Coffee (1 pot) a day       PHYSICAL EXAM  Vitals:   10/26/16 0819  BP: 135/80  Pulse: 72  Weight: 195 lb 12.8 oz (88.8 kg)  Height: 5\' 10"  (1.778 m)   Body mass index is 28.09 kg/m.  Generalized: Well developed, in no acute distress   Neurological examination  Mentation: Alert oriented to time, place, history taking. Follows all commands speech and language fluent Cranial nerve II-XII: Pupils were equal round reactive to light. Extraocular movements were full, visual  field were full on confrontational test. Facial sensation and strength were normal. Uvula tongue midline. Head turning and shoulder shrug  were normal and symmetric. Motor: The motor testing reveals 5 over 5 strength of all 4 extremities. Good symmetric motor tone is noted throughout.  Sensory: Sensory testing is intact to soft touch on all 4 extremities. No evidence of extinction is noted.  Coordination: Cerebellar testing reveals good finger-nose-finger and heel-to-shin bilaterally.  Gait and station: Gait is normal.  Reflexes: Deep tendon reflexes are symmetric and normal bilaterally.   DIAGNOSTIC DATA (LABS, IMAGING, TESTING) - I reviewed patient records, labs, notes, testing and imaging myself where available.  Lab Results  Component Value Date   WBC 5.7 01/20/2016   HCT 47.1 01/20/2016   MCV 94 01/20/2016   PLT 332 01/20/2016      Component Value Date/Time   NA 146 (H) 01/20/2016 0903   K 4.5 01/20/2016 0903   CL 101 01/20/2016 0903   CO2 28 01/20/2016 0903   GLUCOSE 98 01/20/2016 0903   BUN 7 01/20/2016 0903   CREATININE 0.73 (L) 01/20/2016 0903   CALCIUM 9.6 01/20/2016 0903   PROT 7.0 01/20/2016 0903   ALBUMIN 4.7 01/20/2016 0903   AST 22 01/20/2016 0903   ALT 33 01/20/2016 0903   ALKPHOS 81 01/20/2016 0903   BILITOT 0.2 01/20/2016 0903   GFRNONAA 117 01/20/2016 0903   GFRAA 135 01/20/2016 0903      ASSESSMENT AND PLAN 41 y.o. year old male  has a past medical history of Asthma; Hypertension; and Seizures ([place]). here with:  1. Seizures  Overall the patient is doing well. He will continue on Dilantin and Keppra. I will check blood work today. Patient advised that he has any seizure events he should let us know. He will follow-up in one year or sooner if needed. I did review seizure precautions with the patient.  I spent 15 minutes with the patient 50% of this time was spent counseling the patient on seizure precautions.     Ward Givens, MSN, NP-C  10/26/2016, 8:31 AM [facility] [address] [place], [place] [address] [phone]

## 2016-10-27 ENCOUNTER — Telehealth: Payer: Self-pay | Admitting: *Deleted

## 2016-10-27 LAB — CBC WITH DIFFERENTIAL/PLATELET
BASOS ABS: 0 10*3/uL (ref 0.0–0.2)
Basos: 0 %
EOS (ABSOLUTE): 0.2 10*3/uL (ref 0.0–0.4)
Eos: 3 %
Hematocrit: 44.7 % (ref 37.5–51.0)
Hemoglobin: 15.2 g/dL (ref 13.0–17.7)
IMMATURE GRANS (ABS): 0 10*3/uL (ref 0.0–0.1)
Immature Granulocytes: 0 %
LYMPHS: 26 %
Lymphocytes Absolute: 1.3 10*3/uL (ref 0.7–3.1)
MCH: 31.9 pg (ref 26.6–33.0)
MCHC: 34 g/dL (ref 31.5–35.7)
MCV: 94 fL (ref 79–97)
Monocytes Absolute: 0.5 10*3/uL (ref 0.1–0.9)
Monocytes: 11 %
Neutrophils Absolute: 3 10*3/uL (ref 1.4–7.0)
Neutrophils: 60 %
PLATELETS: 307 10*3/uL (ref 150–379)
RBC: 4.77 x10E6/uL (ref 4.14–5.80)
RDW: 13 % (ref 12.3–15.4)
WBC: 5 10*3/uL (ref 3.4–10.8)

## 2016-10-27 LAB — COMPREHENSIVE METABOLIC PANEL
A/G RATIO: 1.9 (ref 1.2–2.2)
ALK PHOS: 101 IU/L (ref 39–117)
ALT: 31 IU/L (ref 0–44)
AST: 22 IU/L (ref 0–40)
Albumin: 4.5 g/dL (ref 3.5–5.5)
BILIRUBIN TOTAL: 0.3 mg/dL (ref 0.0–1.2)
BUN/Creatinine Ratio: 6 — ABNORMAL LOW (ref 9–20)
BUN: 4 mg/dL — ABNORMAL LOW (ref 6–20)
CHLORIDE: 101 mmol/L (ref 96–106)
CO2: 27 mmol/L (ref 18–29)
Calcium: 9.2 mg/dL (ref 8.7–10.2)
Creatinine, Ser: 0.63 mg/dL — ABNORMAL LOW (ref 0.76–1.27)
GFR calc Af Amer: 144 (ref >59)
GFR calc non Af Amer: 124 (ref >59)
GLUCOSE: 78 mg/dL (ref 65–99)
Globulin, Total: 2.4 (ref 1.5–4.5)
POTASSIUM: 4.5 mmol/L (ref 3.5–5.2)
Sodium: 144 mmol/L (ref 134–144)
Total Protein: 6.9 g/dL (ref 6.0–8.5)

## 2016-10-27 LAB — PHENYTOIN LEVEL, TOTAL: PHENYTOIN (DILANTIN), SERUM: 15.8 ug/mL (ref 10.0–20.0)

## 2016-10-27 NOTE — Telephone Encounter (Signed)
LMVM for pt on mobile # (ok per DPR) that lab results unremarkable.   If questions to call us back.

## 2016-10-27 NOTE — Telephone Encounter (Signed)
-----   Message from Ward Givens, NP sent at 10/27/2016  8:34 AM EST ----- Lab work unremarkable. Please call patient.

## 2017-01-07 ENCOUNTER — Other Ambulatory Visit: Payer: Self-pay | Admitting: Adult Health

## 2017-04-05 ENCOUNTER — Other Ambulatory Visit: Payer: Self-pay | Admitting: Adult Health

## 2017-04-15 ENCOUNTER — Telehealth: Payer: Self-pay | Admitting: Adult Health

## 2017-04-15 NOTE — Telephone Encounter (Signed)
Yes if patient is willing

## 2017-04-15 NOTE — Telephone Encounter (Signed)
Jordan Norton with [facility] called in reference to [place] 300 MG ER capsule stating medication is on back order with manufacturer until October.  Jordan Norton would like to know if patient is able to be prescribed generic.  Please call

## 2017-04-18 NOTE — Telephone Encounter (Signed)
Rn call [facility] about pts phenytek 300mg  brand name on back order until 06/2017. Rn stated Dr. Leonie Man left message on Friday 06/15/2017 that pt can use generic until the brand name comes in . Order was given verbal per Dr. Leonie Man from 04/15/2017 telephone note. It was for 2 refills.

## 2017-07-16 ENCOUNTER — Other Ambulatory Visit: Payer: Self-pay | Admitting: Neurology

## 2017-07-18 ENCOUNTER — Other Ambulatory Visit: Payer: Self-pay

## 2017-07-18 MED ORDER — PHENYTOIN SODIUM EXTENDED 300 MG PO CAPS: 300.0000 mg | ORAL_CAPSULE | Freq: Every day | ORAL | 3 refills | Status: DC

## 2017-10-27 ENCOUNTER — Ambulatory Visit: Payer: BLUE CROSS/BLUE SHIELD | Admitting: Adult Health

## 2017-10-27 ENCOUNTER — Encounter (INDEPENDENT_AMBULATORY_CARE_PROVIDER_SITE_OTHER): Payer: Self-pay

## 2017-10-27 ENCOUNTER — Encounter: Payer: Self-pay | Admitting: Adult Health

## 2017-10-27 VITALS — BP 132/77 | HR 75 | Wt 174.2 lb

## 2017-10-27 DIAGNOSIS — Z5181 Encounter for therapeutic drug level monitoring: Secondary | ICD-10-CM

## 2017-10-27 DIAGNOSIS — G40209 Localization-related (focal) (partial) symptomatic epilepsy and epileptic syndromes with complex partial seizures, not intractable, without status epilepticus: Secondary | ICD-10-CM

## 2017-10-27 NOTE — Patient Instructions (Signed)
Your Plan:  Continue keppra and dilantin Obtain blood work today Notify use with any seizure activity Follow up in 1 year with Jinny Blossom, NP   Thank you for coming to see [place] at [facility]. I hope we have been able to provide you high quality care today.  You may receive a patient satisfaction survey over the next few weeks. We would appreciate your feedback and comments so that we may continue to improve ourselves and the health of our patients.

## 2017-10-27 NOTE — Progress Notes (Signed)
PATIENT: Jordan Norton DOB: 03/22/1976  REASON FOR VISIT: follow up- seizures HISTORY FROM: patient  HISTORY OF PRESENT ILLNESS:  Today 10/27/17: Jordan Norton is a 42 year old Caucasian male with a history of seizures.  He returns today for a one-year follow-up visit.  Patient continues to take Dilantin and Keppra and denies side effects.  Reports his mood and behavior is stable.  States in the past 6 months, he has had sensation of leaning at times where he has to catch himself.  Reports this happens every couple of months having had this sensation about 2-3 times.  This sensation subsides within 30 seconds and feels completely back to normal.  Patient denies falls or any other gait or balance problems.  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  Patient denies any seizure activity or new medical issues since previous visit.  He returns today for reevaluation.  HISTORY 10/26/16: Jordan Norton is a 42 year old male with a history of seizures. He returns today for follow-up. He is currently on Dilantin and Keppra. He reports that he has not had any additional seizures. Denies any changes with his gait or balance. He does operate a Teacher, music. Denies any changes with his mood or behavior. States that he is sleeping well. He is able to complete all ADLs independently. He works full-time for a Copywriter, advertising. He returns today for an evaluation.  HISTORY 01/21/16: Jordan Norton is a 42 year old male with a history of seizures. He returns today for follow-up. He is currently on Dilantin 100 mg daily. At the last visit he was started on Keppra extended release 500 mg daily. He reports that he has not had any additional seizure events. He reports that he is tolerating Keppra well. He states that he has noticed he gets irritated easily however none of his friends or family have noticed this. He is not operating a motor vehicle. He continues to work. He lives at  home alone. He is able to complete all ADLs independently. He returns today for an evaluation.  REVIEW OF SYSTEMS: Out of a complete 14 system review of symptoms, the patient complains only of the following symptoms, environmental allergies and food allergies and all other reviewed systems are negative.  See history of present illness  ALLERGIES: Allergies  Allergen Reactions  . Eggs Or Egg-Derived Products     Asthma or hives or rash Cannot take Flu vaccine due to egg allergy  . Acetaminophen   . Hydrocodone-Acetaminophen Other (See Comments)    unknown  . Levofloxacin   . Penicillins     HOME MEDICATIONS: Outpatient Medications Prior to Visit  Medication Sig Dispense Refill  . albuterol (PROAIR HFA) 108 (90 BASE) MCG/ACT inhaler Inhale into the lungs.    . fluticasone (FLONASE) 50 MCG/ACT nasal spray Place into the nose.    . fluticasone (FLOVENT HFA) 44 MCG/ACT inhaler Inhale into the lungs.    . levETIRAcetam (KEPPRA XR) 500 MG 24 hr tablet TAKE 1 TABLET (500 MG TOTAL) BY MOUTH DAILY. 90 tablet 3  . loratadine (CLARITIN) 10 MG tablet Take by mouth.    . losartan (COZAAR) 50 MG tablet Take by mouth.    . pantoprazole (PROTONIX) 40 MG tablet Take 40 mg by mouth daily.  3  . phenytoin (PHENYTEK) 300 MG ER capsule Take 1 capsule (300 mg total) daily by mouth. 90 capsule 3   No facility-administered medications prior to visit.     PAST  MEDICAL HISTORY: Past Medical History:  Diagnosis Date  . Asthma   . Hypertension   . Seizures ([place])     PAST SURGICAL HISTORY: No past surgical history on file.  FAMILY HISTORY: Family History  Problem Relation Age of Onset  . Parkinsonism Mother   . Cancer Mother   . Hypertension Father   . Cancer Maternal Uncle   . Parkinsonism Maternal Grandmother   . Heart disease Maternal Grandfather   . Diabetes Paternal Grandmother     SOCIAL HISTORY: Social History   Socioeconomic History  . Marital status: Single    Spouse name:  Not on file  . Number of children: Not on file  . Years of education: 16  . Highest education level: Not on file  Social Needs  . Financial resource strain: Not on file  . Food insecurity - worry: Not on file  . Food insecurity - inability: Not on file  . Transportation needs - medical: Not on file  . Transportation needs - non-medical: Not on file  Occupational History  . 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 Smoking status: Never Smoker  . Smokeless tobacco: Never Used  Substance and Sexual Activity  . Alcohol use: No  . Drug use: Yes    Types: Marijuana  . Sexual activity: No  Other Topics Concern  . Not on file  Social History Narrative   Lives alone in a home    Drinks Coffee (1 pot) a day       PHYSICAL EXAM  Vitals:   10/27/17 0814  BP: 132/77  Pulse: 75  Weight: 174 lb 3.2 oz (79 kg)   Body mass index is 25 kg/m.  Generalized: Well developed, in no acute distress   Neurological examination  Mentation: Alert oriented to time, place, history taking. Follows all commands speech and language fluent Cranial nerve II-XII: Pupils were equal round reactive to light. Extraocular movements were full, visual field were full on confrontational test. Facial sensation and strength were normal. Uvula tongue midline. Head turning and shoulder shrug  were normal and symmetric. Motor: The motor testing reveals 5 over 5 strength of all 4 extremities. Good symmetric motor tone is noted throughout.  Sensory: Sensory testing is intact to soft touch on all 4 extremities. No evidence of extinction is noted.  Coordination: Cerebellar testing reveals good finger-nose-finger and heel-to-shin bilaterally.  Gait and station: Gait is normal.  Reflexes: Deep tendon reflexes are symmetric and normal bilaterally.   DIAGNOSTIC DATA (LABS, IMAGING, TESTING) - I reviewed patient records, labs, notes, testing and imaging myself where available.  Lab Results  Component Value  Date   WBC 5.0 10/26/2016   HGB 15.2 10/26/2016   HCT 44.7 10/26/2016   MCV 94 10/26/2016   PLT 307 10/26/2016      Component Value Date/Time   NA 144 10/26/2016 0848   K 4.5 10/26/2016 0848   CL 101 10/26/2016 0848   CO2 27 10/26/2016 0848   GLUCOSE 78 10/26/2016 0848   BUN 4 (L) 10/26/2016 0848   CREATININE 0.63 (L) 10/26/2016 0848   CALCIUM 9.2 10/26/2016 0848   PROT 6.9 10/26/2016 0848   ALBUMIN 4.5 10/26/2016 0848   AST 22 10/26/2016 0848   ALT 31 10/26/2016 0848   ALKPHOS 101 10/26/2016 0848   BILITOT 0.3 10/26/2016 0848   GFRNONAA 124 10/26/2016 0848   GFRAA 144 10/26/2016 0848      ASSESSMENT AND PLAN 42 y.o. year old male  has a past medical history of Asthma, Hypertension, and Seizures ([place]). here with:  1. Seizures  Patient is doing well overall.  He continues to take the Dilantin and Keppra.  Blood work will be checked today including CBC with differential, CMP, and Dilantin level.  Patient does state that he did not take his medications this morning prior to this blood draw.  He was advised to notify us with any seizure activity or any other concerns.  He will follow-up in 1 year or sooner if needed.  I spent 15 minutes with the patient. 50% of this time was spent discussing plan of care and seizure precautions  This patient was seen by Venancio Poisson, NP-I agree with the assessment and plan of care.  I also spoke to the patient and reviewed assessment and plan with him.   Ward Givens, MSN, NP-C 10/27/2017, 9:43 AM [facility] [address] [place], [place] [address] [phone]

## 2017-10-27 NOTE — Progress Notes (Signed)
I have read the note, and I agree with the clinical assessment and plan.  Tavien Chestnut K Neo Yepiz   

## 2017-10-28 LAB — CBC WITH DIFFERENTIAL/PLATELET
BASOS: 0 %
Basophils Absolute: 0 10*3/uL (ref 0.0–0.2)
EOS (ABSOLUTE): 0.1 10*3/uL (ref 0.0–0.4)
EOS: 1 %
HEMOGLOBIN: 15.5 g/dL (ref 13.0–17.7)
Hematocrit: 44.8 % (ref 37.5–51.0)
Immature Grans (Abs): 0 10*3/uL (ref 0.0–0.1)
Immature Granulocytes: 0 %
LYMPHS ABS: 1 10*3/uL (ref 0.7–3.1)
Lymphs: 15 %
MCH: 32.3 pg (ref 26.6–33.0)
MCHC: 34.6 g/dL (ref 31.5–35.7)
MCV: 93 fL (ref 79–97)
MONOS ABS: 0.6 10*3/uL (ref 0.1–0.9)
Monocytes: 8 %
NEUTROS ABS: 5 10*3/uL (ref 1.4–7.0)
Neutrophils: 76 %
Platelets: 378 10*3/uL (ref 150–379)
RBC: 4.8 x10E6/uL (ref 4.14–5.80)
RDW: 12.5 % (ref 12.3–15.4)
WBC: 6.7 10*3/uL (ref 3.4–10.8)

## 2017-10-28 LAB — COMPREHENSIVE METABOLIC PANEL
ALBUMIN: 4.3 g/dL (ref 3.5–5.5)
ALK PHOS: 100 IU/L (ref 39–117)
ALT: 22 IU/L (ref 0–44)
AST: 24 IU/L (ref 0–40)
Albumin/Globulin Ratio: 1.7 (ref 1.2–2.2)
BUN / CREAT RATIO: 7 — AB (ref 9–20)
BUN: 5 mg/dL — ABNORMAL LOW (ref 6–24)
Bilirubin Total: 0.3 mg/dL (ref 0.0–1.2)
CO2: 25 mmol/L (ref 20–29)
CREATININE: 0.75 mg/dL — AB (ref 0.76–1.27)
Calcium: 9.2 mg/dL (ref 8.7–10.2)
Chloride: 101 mmol/L (ref 96–106)
GFR calc non Af Amer: 115 mL/min/{1.73_m2} (ref >59)
GFR, EST AFRICAN AMERICAN: 133 mL/min/{1.73_m2} (ref >59)
GLOBULIN, TOTAL: 2.6 g/dL (ref 1.5–4.5)
Glucose: 92 mg/dL (ref 65–99)
Potassium: 4.4 mmol/L (ref 3.5–5.2)
SODIUM: 142 mmol/L (ref 134–144)
TOTAL PROTEIN: 6.9 g/dL (ref 6.0–8.5)

## 2017-10-28 LAB — PHENYTOIN LEVEL, TOTAL: Phenytoin (Dilantin), Serum: 11 ug/mL (ref 10.0–20.0)

## 2018-01-10 ENCOUNTER — Other Ambulatory Visit: Payer: Self-pay | Admitting: Neurology

## 2018-02-07 ENCOUNTER — Ambulatory Visit
Admission: RE | Admit: 2018-02-07 | Discharge: 2018-02-07 | Disposition: A | Discharge status: Home / Self Care | Payer: BLUE CROSS/BLUE SHIELD | Source: Ambulatory Visit | Attending: Family Medicine | Admitting: Family Medicine

## 2018-02-07 ENCOUNTER — Other Ambulatory Visit: Payer: Self-pay | Admitting: Family Medicine

## 2018-02-07 DIAGNOSIS — R109 Unspecified abdominal pain: Secondary | ICD-10-CM

## 2018-02-07 DIAGNOSIS — M545 Low back pain, unspecified: Secondary | ICD-10-CM

## 2018-02-07 DIAGNOSIS — R319 Hematuria, unspecified: Secondary | ICD-10-CM

## 2018-03-27 ENCOUNTER — Other Ambulatory Visit: Payer: Self-pay | Admitting: Neurology

## 2018-06-29 ENCOUNTER — Other Ambulatory Visit: Payer: Self-pay | Admitting: Neurology

## 2018-06-30 ENCOUNTER — Other Ambulatory Visit: Payer: Self-pay

## 2018-06-30 MED ORDER — PHENYTOIN SODIUM EXTENDED 300 MG PO CAPS: ORAL_CAPSULE | ORAL | 3 refills | Status: DC

## 2018-09-26 ENCOUNTER — Other Ambulatory Visit: Payer: Self-pay | Admitting: Neurology

## 2018-10-30 ENCOUNTER — Ambulatory Visit: Payer: BLUE CROSS/BLUE SHIELD | Admitting: Adult Health

## 2018-12-26 ENCOUNTER — Other Ambulatory Visit: Payer: Self-pay | Admitting: Neurology

## 2018-12-28 ENCOUNTER — Telehealth: Payer: Self-pay | Admitting: Adult Health

## 2018-12-28 NOTE — Telephone Encounter (Signed)
12-28-2018 Pt has called and gave verbal consent for a telephone visit for 01-02-2019 to be filled to his insurance, pt does not have internet access.  phone rep discussed the consent for video and phone visits, copay, the limitations to the physical exam, etc.

## 2019-01-01 NOTE — Telephone Encounter (Signed)
LMVM for pt to return call to update chart.

## 2019-01-02 ENCOUNTER — Other Ambulatory Visit: Payer: Self-pay

## 2019-01-02 ENCOUNTER — Encounter: Payer: Self-pay | Admitting: Adult Health

## 2019-01-02 ENCOUNTER — Ambulatory Visit (INDEPENDENT_AMBULATORY_CARE_PROVIDER_SITE_OTHER): Payer: BLUE CROSS/BLUE SHIELD | Admitting: Family Medicine

## 2019-01-02 ENCOUNTER — Ambulatory Visit: Payer: BLUE CROSS/BLUE SHIELD | Admitting: Adult Health

## 2019-01-02 ENCOUNTER — Encounter: Payer: Self-pay | Admitting: Family Medicine

## 2019-01-02 DIAGNOSIS — G40209 Localization-related (focal) (partial) symptomatic epilepsy and epileptic syndromes with complex partial seizures, not intractable, without status epilepticus: Secondary | ICD-10-CM

## 2019-01-02 NOTE — Progress Notes (Signed)
PATIENT: Jordan Norton DOB: 1976-08-11  REASON FOR VISIT: follow up HISTORY FROM: patient  Virtual Visit via Telephone Note  I connected with Jordan Norton on 01/02/19 at  9:00 AM EDT by telephone and verified that I am speaking with the correct person using two identifiers.   I discussed the limitations, risks, security and privacy concerns of performing an evaluation and management service by telephone and the availability of in person appointments. I also discussed with the patient that there may be a patient responsible charge related to this service. The patient expressed understanding and agreed to proceed.   History of Present Illness:  01/02/19 Jordan Norton is a 43 y.o. male for follow up of seizures.  He continues Keppra 500 mg daily and Dilantin 300 mg daily.  He is doing well and without complaints today.  He denies any concerns of adverse effects of medications.  He denies any seizure activity.  He is working and driving.   HISTORY (copied from [facility] note on 10/27/2017)  Today 10/27/17: Jordan Norton is a 43 year old Caucasian male with a history of seizures.  He returns today for a one-year follow-up visit.  Patient continues to take Dilantin and Keppra and denies side effects.  Reports his mood and behavior is stable.  States in the past 6 months, he has had sensation of leaning at times where he has to catch himself.  Reports this happens every couple of months having had this sensation about 2-3 times.  This sensation subsides within 30 seconds and feels completely back to normal.  Patient denies falls or any other gait or balance problems.  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  Patient denies any seizure activity or new medical issues since previous visit.  He returns today for reevaluation.  HISTORY 10/26/16: Jordan Norton is a 43 year old male with a history of seizures. He returns today for follow-up. He is currently  on Dilantin and Keppra. He reports that he has not had any additional seizures. Denies any changes with his gait or balance. He does operate a Teacher, music. Denies any changes with his mood or behavior. States that he is sleeping well. He is able to complete all ADLs independently. He works full-time for a Copywriter, advertising. He returns today for an evaluation.  HISTORY 01/21/16: Jordan Norton is a 43 year old male with a history of seizures. He returns today for follow-up. He is currently on Dilantin 100 mg daily. At the last visit he was started on Keppra extended release 500 mg daily. He reports that he has not had any additional seizure events. He reports that he is tolerating Keppra well. He states that he has noticed he gets irritated easily however none of his friends or family have noticed this. He is not operating a motor vehicle. He continues to work. He lives at home alone. He is able to complete all ADLs independently. He returns today for an evaluation.   Observations/Objective:  Generalized: Well developed, in no acute distress  Mentation: Alert oriented to time, place, history taking. Follows all commands speech and language fluent   Assessment and Plan:  43 y.o. year old male  has a past medical history of Asthma, Hypertension, and Seizures ([place]). here with    ICD-10-CM   1. Partial symptomatic epilepsy with complex partial seizures, not intractable, without status epilepticus (HCC) G40.209 CBC with Differential/Platelets    CMP    Phenytoin Level, Total   He is  doing very well on Dilantin and Keppra as prescribed.  We will continue this therapy.  I have advised he come by the office for updated labs and directed him of hours due to pandemic.  I have advised annual follow-up.  He will call sooner with any concerns of seizure activity.  He verbalizes understanding and agreement with this plan.  Orders Placed This Encounter  Procedures  . CBC with Differential/Platelets     Standing Status:   Future    Standing Expiration Date:   01/02/2020  . CMP    Standing Status:   Future    Standing Expiration Date:   01/02/2020  . Phenytoin Level, Total    Standing Status:   Future    Standing Expiration Date:   01/02/2020    No orders of the defined types were placed in this encounter.    Follow Up Instructions:  I discussed the assessment and treatment plan with the patient. The patient was provided an opportunity to ask questions and all were answered. The patient agreed with the plan and demonstrated an understanding of the instructions.   The patient was advised to call back or seek an in-person evaluation if the symptoms worsen or if the condition fails to improve as anticipated.  I provided 20 minutes of non-face-to-face time during this encounter.  Patient is located at his place of residence during video conference.  Provider is located at her place of residence.  Liane Comber, RN helped to facilitate visit.   Debbora Presto, NP

## 2019-01-02 NOTE — Telephone Encounter (Signed)
Spoke to pt and updated chart.  Call mobile for TELEPHONE VISIT.

## 2019-01-02 NOTE — Progress Notes (Signed)
I agree with the above plan 

## 2019-01-24 ENCOUNTER — Telehealth: Payer: Self-pay

## 2019-01-24 NOTE — Telephone Encounter (Signed)
Unable to get in contact with the patient to schedule their yearly follow up with Amy. I left a voicemail asking the patient to return my call. Office number was provided.  If patient calls back please schedule their follow up appt.   

## 2019-03-19 ENCOUNTER — Other Ambulatory Visit: Payer: Self-pay | Admitting: Neurology

## 2019-03-26 ENCOUNTER — Other Ambulatory Visit: Payer: Self-pay | Admitting: Neurology

## 2019-06-27 ENCOUNTER — Telehealth: Payer: Self-pay | Admitting: Family Medicine

## 2019-06-27 MED ORDER — LEVETIRACETAM ER 500 MG PO TB24: 500.0000 mg | ORAL_TABLET | Freq: Every day | ORAL | 0 refills | Status: DC

## 2019-06-27 NOTE — Telephone Encounter (Signed)
Stew from [facility] called stating that the prescription for the pt's levETIRAcetam (KEPPRA XR) 500 MG 24 hr tablet was bounced back stating that the pt was unknown to the provider. Stew confirmed that the pt was a pt here and that he was seen by Dr. Jannifer Franklin in the past and he would like to know if the prescription can be e scribed back to him at the pharmacy. Please advise.

## 2019-06-27 NOTE — Telephone Encounter (Signed)
New Rx sent for levirteracetam as requested by [facility].

## 2019-09-24 ENCOUNTER — Telehealth: Payer: Self-pay | Admitting: Family Medicine

## 2019-09-24 MED ORDER — LEVETIRACETAM ER 500 MG PO TB24: 500.0000 mg | ORAL_TABLET | Freq: Every day | ORAL | 0 refills | Status: DC

## 2019-09-24 NOTE — Telephone Encounter (Signed)
I spoke to [place], and asked relayed that did refill, but the pt needs to have lab work done. His next appt is in 12/2019. (annual).  She would write on the bag.

## 2019-09-24 NOTE — Addendum Note (Signed)
Addended by: Oliver Hum S on: 09/24/2019 11:46 AM   Modules accepted: Orders

## 2019-09-24 NOTE — Telephone Encounter (Signed)
[email or web address] [place] [place] has called to report they are getting a message back re: pt's levETIRAcetam (KEPPRA XR) 500 MG 24 hr tablet that the pt is not known to provider.  [email or web address] is asking for a call from RN

## 2019-10-01 ENCOUNTER — Other Ambulatory Visit: Payer: Self-pay

## 2019-10-01 ENCOUNTER — Other Ambulatory Visit (INDEPENDENT_AMBULATORY_CARE_PROVIDER_SITE_OTHER): Payer: Self-pay

## 2019-10-01 DIAGNOSIS — G40209 Localization-related (focal) (partial) symptomatic epilepsy and epileptic syndromes with complex partial seizures, not intractable, without status epilepticus: Secondary | ICD-10-CM

## 2019-10-01 DIAGNOSIS — Z0289 Encounter for other administrative examinations: Secondary | ICD-10-CM

## 2019-10-02 ENCOUNTER — Telehealth: Payer: Self-pay | Admitting: *Deleted

## 2019-10-02 LAB — COMPREHENSIVE METABOLIC PANEL
ALT: 25 IU/L (ref 0–44)
AST: 25 IU/L (ref 0–40)
Albumin/Globulin Ratio: 2 (ref 1.2–2.2)
Albumin: 4.3 g/dL (ref 4.0–5.0)
Alkaline Phosphatase: 69 IU/L (ref 39–117)
BUN/Creatinine Ratio: 9 (ref 9–20)
BUN: 6 mg/dL (ref 6–24)
Bilirubin Total: <0.2 mg/dL (ref 0.0–1.2)
CO2: 26 mmol/L (ref 20–29)
Calcium: 9.3 mg/dL (ref 8.7–10.2)
Chloride: 102 mmol/L (ref 96–106)
Creatinine, Ser: 0.65 mg/dL — ABNORMAL LOW (ref 0.76–1.27)
GFR calc Af Amer: 139 mL/min/{1.73_m2} (ref >59)
GFR calc non Af Amer: 120 mL/min/{1.73_m2} (ref >59)
Globulin, Total: 2.1 g/dL (ref 1.5–4.5)
Glucose: 82 mg/dL (ref 65–99)
Potassium: 4.4 mmol/L (ref 3.5–5.2)
Sodium: 142 mmol/L (ref 134–144)
Total Protein: 6.4 g/dL (ref 6.0–8.5)

## 2019-10-02 LAB — CBC WITH DIFFERENTIAL/PLATELET
Basophils Absolute: 0 10*3/uL (ref 0.0–0.2)
Basos: 1 %
EOS (ABSOLUTE): 0.1 10*3/uL (ref 0.0–0.4)
Eos: 3 %
Hematocrit: 45.8 % (ref 37.5–51.0)
Hemoglobin: 15.6 g/dL (ref 13.0–17.7)
Immature Grans (Abs): 0 10*3/uL (ref 0.0–0.1)
Immature Granulocytes: 0 %
Lymphocytes Absolute: 1.2 10*3/uL (ref 0.7–3.1)
Lymphs: 33 %
MCH: 33.1 pg — ABNORMAL HIGH (ref 26.6–33.0)
MCHC: 34.1 g/dL (ref 31.5–35.7)
MCV: 97 fL (ref 79–97)
Monocytes Absolute: 0.3 10*3/uL (ref 0.1–0.9)
Monocytes: 7 %
Neutrophils Absolute: 2.1 10*3/uL (ref 1.4–7.0)
Neutrophils: 56 %
Platelets: 340 10*3/uL (ref 150–450)
RBC: 4.72 x10E6/uL (ref 4.14–5.80)
RDW: 11.7 % (ref 11.6–15.4)
WBC: 3.7 10*3/uL (ref 3.4–10.8)

## 2019-10-02 LAB — PHENYTOIN LEVEL, TOTAL: Phenytoin (Dilantin), Serum: 8.2 ug/mL — ABNORMAL LOW (ref 10.0–20.0)

## 2019-10-02 NOTE — Telephone Encounter (Signed)
LVM informing patient that his labs look ok. Dilantin levels were a little low but that could have been related to timing of when he took the medication. As long as he is feeling well and no seizure activity, we will continue current dose. Left # for any questions, concerns.

## 2019-12-20 ENCOUNTER — Other Ambulatory Visit: Payer: Self-pay

## 2019-12-20 MED ORDER — LEVETIRACETAM ER 500 MG PO TB24: 500.0000 mg | ORAL_TABLET | Freq: Every day | ORAL | 0 refills | Status: DC

## 2020-01-02 ENCOUNTER — Encounter: Payer: Self-pay | Admitting: Family Medicine

## 2020-01-02 ENCOUNTER — Other Ambulatory Visit: Payer: Self-pay

## 2020-01-02 ENCOUNTER — Ambulatory Visit: Payer: BC Managed Care – PPO | Admitting: Family Medicine

## 2020-01-02 VITALS — BP 145/86 | HR 82 | Temp 98.3°F | Ht 70.0 in | Wt 164.2 lb

## 2020-01-02 DIAGNOSIS — G40209 Localization-related (focal) (partial) symptomatic epilepsy and epileptic syndromes with complex partial seizures, not intractable, without status epilepticus: Secondary | ICD-10-CM | POA: Diagnosis not present

## 2020-01-02 MED ORDER — PHENYTOIN SODIUM EXTENDED 300 MG PO CAPS: ORAL_CAPSULE | ORAL | 3 refills | Status: DC

## 2020-01-02 MED ORDER — LEVETIRACETAM ER 500 MG PO TB24: 500.0000 mg | ORAL_TABLET | Freq: Every day | ORAL | 3 refills | Status: DC

## 2020-01-02 NOTE — Progress Notes (Signed)
PATIENT: Norberto Proffitt DOB: [birth year]  REASON FOR VISIT: follow up HISTORY FROM: patient  Chief Complaint  Patient presents with  . Follow-up    Rm2, alone,  No seizures.  . Seizures     HISTORY OF PRESENT ILLNESS: Today 01/02/20 Pharrell Nikas is a 44 y.o. male here today for follow up for seizures. He continues levetiracetam XR 500mg  daily and phenytoin 300mg  daily.  He is feeling well today.  He is tolerating medications well.  No seizure activity.  Labs were performed in 09/2019.  He is followed annually by primary care.   HISTORY: (copied from my note on 01/02/2019)  Nasean Higinbotham is a 44 y.o. male for follow up of seizures.  He continues Keppra 500 mg daily and Dilantin 300 mg daily.  He is doing well and without complaints today.  He denies any concerns of adverse effects of medications.  He denies any seizure activity.  He is working and driving.   HISTORY (copied from [facility] note on 10/27/2017)  Today2/21/19: Mr. Mcanulty is a 44 year old Caucasian male with a history of seizures. He returns today for a one-year follow-up visit. Patient continues to take Dilantin and Keppra and denies side effects. Reports his mood and behavior is stable.States in the past 6 months, he has had sensation of leaning at times where he has to catch himself. Reports this happens every couple of months having had this sensation about 2-3 times. This sensation subsides within 30 seconds and feels completely back to normal. Patient denies falls or any other gait or balance problems. 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 Patient denies any seizure activity or new medical issues since previous visit. He returns today for reevaluation.   REVIEW OF SYSTEMS: Out of a complete 14 system review of symptoms, the patient complains only of the following symptoms, none and all other reviewed systems are negative.  ALLERGIES: Allergies    Allergen Reactions  . Eggs Or Egg-Derived Products     Asthma or hives or rash Cannot take Flu vaccine due to egg allergy  . Acetaminophen   . Hydrocodone-Acetaminophen Other (See Comments)    unknown  . Levofloxacin   . Penicillins     HOME MEDICATIONS: Outpatient Medications Prior to Visit  Medication Sig Dispense Refill  . albuterol (PROAIR HFA) 108 (90 BASE) MCG/ACT inhaler Inhale into the lungs.    . fluticasone (FLONASE) 50 MCG/ACT nasal spray Place into the nose.    . fluticasone (FLOVENT HFA) 44 MCG/ACT inhaler Inhale into the lungs.    Marland Kitchen loratadine (CLARITIN) 10 MG tablet Take by mouth.    . losartan (COZAAR) 50 MG tablet Take by mouth.    . pantoprazole (PROTONIX) 40 MG tablet Take 40 mg by mouth daily.  3  . levETIRAcetam (KEPPRA XR) 500 MG 24 hr tablet Take 1 tablet (500 mg total) by mouth daily. 90 tablet 0  . phenytoin (DILANTIN) 300 MG ER capsule TAKE (1) CAPSULE DAILY 90 capsule 3   No facility-administered medications prior to visit.    PAST MEDICAL HISTORY: Past Medical History:  Diagnosis Date  . Asthma   . Hypertension   . Seizures ([place])     PAST SURGICAL HISTORY: Past Surgical History:  Procedure Laterality Date  . DENTAL SURGERY      FAMILY HISTORY: Family History  Problem Relation Age of Onset  . Parkinsonism Mother   . Cancer Mother   . Hypertension Father   .  Cancer Maternal Uncle   . Parkinsonism Maternal Grandmother   . Heart disease Maternal Grandfather   . Diabetes Paternal Grandmother     SOCIAL HISTORY: Social History   Socioeconomic History  . Marital status: Single    Spouse name: Not on file  . Number of children: Not on file  . Years of education: 46  . Highest education level: Not on file  Occupational History  . 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 Smoking status: Never Smoker  . Smokeless tobacco: Never Used  Substance and Sexual Activity  . Alcohol use: No  . Drug use: Yes    Types:  Marijuana  . Sexual activity: Never  Other Topics Concern  . Not on file  Social History Narrative   Lives alone in a home    Drinks Coffee (1 pot) a day    Social Determinants of Health   Financial Resource Strain:   . Difficulty of Paying Living Expenses:   Food Insecurity:   . Worried About Charity fundraiser in the Last Year:   . Arboriculturist in the Last Year:   Transportation Needs:   . Film/video editor (Medical):   Marland Kitchen Lack of Transportation (Non-Medical):   Physical Activity:   . Days of Exercise per Week:   . Minutes of Exercise per Session:   Stress:   . Feeling of Stress :   Social Connections:   . Frequency of Communication with Friends and Family:   . Frequency of Social Gatherings with Friends and Family:   . Attends Religious Services:   . Active Member of Clubs or Organizations:   . Attends Archivist Meetings:   Marland Kitchen Marital Status:   Intimate Partner Violence:   . Fear of Current or Ex-Partner:   . Emotionally Abused:   Marland Kitchen Physically Abused:   . Sexually Abused:       PHYSICAL EXAM  Vitals:   01/02/20 0804  BP: (!) 145/86  Pulse: 82  Temp: 98.3 F (36.8 C)  Weight: 164 lb 3.2 oz (74.5 kg)  Height: 5\' 10"  (1.778 m)   Body mass index is 23.56 kg/m.  Generalized: Well developed, in no acute distress  Cardiology: normal rate and rhythm, no murmur noted Respiratory: clear to auscultation bilaterally  Neurological examination  Mentation: Alert oriented to time, place, history taking. Follows all commands speech and language fluent Cranial nerve II-XII: Pupils were equal round reactive to light. Extraocular movements were full, visual field were full on confrontational test. Facial sensation and strength were normal. Head turning and shoulder shrug  were normal and symmetric. Motor: The motor testing reveals 5 over 5 strength of all 4 extremities. Good symmetric motor tone is noted throughout.  Sensory: Sensory testing is intact to  soft touch on all 4 extremities. No evidence of extinction is noted.  Coordination: Cerebellar testing reveals good finger-nose-finger and heel-to-shin bilaterally.  Gait and station: Gait is normal.  Reflexes: Deep tendon reflexes are symmetric and normal bilaterally.   DIAGNOSTIC DATA (LABS, IMAGING, TESTING) - I reviewed patient records, labs, notes, testing and imaging myself where available.  No flowsheet data found.   Lab Results  Component Value Date   WBC 3.7 10/01/2019   HGB 15.6 10/01/2019   HCT 45.8 10/01/2019   MCV 97 10/01/2019   PLT 340 10/01/2019      Component Value Date/Time   NA 142 10/01/2019 0807   K 4.4 10/01/2019 [id number]  CL 102 10/01/2019 0807   CO2 26 10/01/2019 0807   GLUCOSE 82 10/01/2019 0807   BUN 6 10/01/2019 0807   CREATININE 0.65 (L) 10/01/2019 0807   CALCIUM 9.3 10/01/2019 0807   PROT 6.4 10/01/2019 0807   ALBUMIN 4.3 10/01/2019 0807   AST 25 10/01/2019 0807   ALT 25 10/01/2019 0807   ALKPHOS 69 10/01/2019 0807   BILITOT <0.2 10/01/2019 0807   GFRNONAA 120 10/01/2019 0807   GFRAA 139 10/01/2019 0807   No results found for: CHOL, HDL, LDLCALC, LDLDIRECT, TRIG, CHOLHDL No results found for: HGBA1C No results found for: VITAMINB12 No results found for: TSH     ASSESSMENT AND PLAN 44 y.o. year old male  has a past medical history of Asthma, Hypertension, and Seizures ([place]). here with     ICD-10-CM   1. Partial symptomatic epilepsy with complex partial seizures, not intractable, without status epilepticus (HCC)  G40.209 phenytoin (DILANTIN) 300 MG ER capsule    levETIRAcetam (KEPPRA XR) 500 MG 24 hr tablet    Mr Mitchel is doing well today.  He continues levetiracetam XR 500 mg daily as well as phenytoin 300 mg ER daily.  We will continue current therapy.  He was advised to continue annual follow-up with primary care and keep a close eye on his blood pressures.  Well-balanced diet, regular exercise and adequate hydration encouraged.  Seizure  precautions reviewed.  He will follow-up with [place] in 1 year, sooner if needed.  He verbalizes understanding and agreement with this plan.   No orders of the defined types were placed in this encounter.    Meds ordered this encounter  Medications  . phenytoin (DILANTIN) 300 MG ER capsule    Sig: Take 1 tablet by mouth daily    Dispense:  90 capsule    Refill:  3    Order Specific Question:   Supervising Provider    Answer:   Melvenia Beam [id number]  . levETIRAcetam (KEPPRA XR) 500 MG 24 hr tablet    Sig: Take 1 tablet (500 mg total) by mouth daily.    Dispense:  90 tablet    Refill:  3    Order Specific Question:   Supervising Provider    Answer:   Melvenia Beam [id number]      I spent 15 minutes with the patient. 50% of this time was spent counseling and educating patient on plan of care and medications.    Debbora Presto, FNP-C 01/02/2020, 8:48 AM [facility] [address] [place], [place] [address] [address] [phone]

## 2020-01-02 NOTE — Patient Instructions (Signed)
Continue levetiracetam XR 500mg  and phenytoin 300mg  daily.   Stay well hydrated. Focus on a healthy, well balanced diet and regular exercise.   Follow up in 1 year  Seizure, Adult A seizure is a sudden burst of abnormal electrical activity in the brain. Seizures usually last from 30 seconds to 2 minutes. They can cause many different symptoms. Usually, seizures are not harmful unless they last a long time. What are the causes? Common causes of this condition include:  Fever or infection.  Conditions that affect the brain, such as: ? A brain abnormality that you were born with. ? A brain or head injury. ? Bleeding in the brain. ? A tumor. ? Stroke. ? Brain disorders such as autism or cerebral palsy.  Low blood sugar.  Conditions that are passed from parent to child (are inherited).  Problems with substances, such as: ? Having a reaction to a drug or a medicine. ? Suddenly stopping the use of a substance (withdrawal). In some cases, the cause may not be known. A person who has repeated seizures over time without a clear cause has a condition called epilepsy. What increases the risk? You are more likely to get this condition if you have:  A family history of epilepsy.  Had a seizure in the past.  A brain disorder.  A history of head injury, lack of oxygen at birth, or strokes. What are the signs or symptoms? There are many types of seizures. The symptoms vary depending on the type of seizure you have. Examples of symptoms during a seizure include:  Shaking (convulsions).  Stiffness in the body.  Passing out (losing consciousness).  Head nodding.  Staring.  Not responding to sound or touch.  Loss of bladder control and bowel control. Some people have symptoms right before and right after a seizure happens. Symptoms before a seizure may include:  Fear.  Worry (anxiety).  Feeling like you may vomit (nauseous).  Feeling like the room is spinning  (vertigo).  Feeling like you saw or heard something before (dj vu).  Odd tastes or smells.  Changes in how you see. You may see flashing lights or spots. Symptoms after a seizure happens can include:  Confusion.  Sleepiness.  Headache.  Weakness on one side of the body. How is this treated? Most seizures will stop on their own in under 5 minutes. In these cases, no treatment is needed. Seizures that last longer than 5 minutes will usually need treatment. Treatment can include:  Medicines given through an IV tube.  Avoiding things that are known to cause your seizures. These can include medicines that you take for another condition.  Medicines to treat epilepsy.  Surgery to stop the seizures. This may be needed if medicines do not help. Follow these instructions at home: Medicines  Take over-the-counter and prescription medicines only as told by your doctor.  Do not eat or drink anything that may keep your medicine from working, such as alcohol. Activity  Do not do any activities that would be dangerous if you had another seizure, like driving or swimming. Wait until your doctor says it is safe for you to do them.  If you live in the [place] ask your local DMV (department of motor vehicles) when you can drive.  Get plenty of rest. Teaching others Teach friends and family what to do when you have a seizure. They should:  Lay you on the ground.  Protect your head and body.  Loosen any tight clothing around  your neck.  Turn you on your side.  Not hold you down.  Not put anything into your mouth.  Know whether or not you need emergency care.  Stay with you until you are better.  General instructions  Contact your doctor each time you have a seizure.  Avoid anything that gives you seizures.  Keep a seizure diary. Write down: ? What you think caused each seizure. ? What you remember about each seizure.  Keep all follow-up visits as told by your doctor.  This is important. Contact a doctor if:  You have another seizure.  You have seizures more often.  There is any change in what happens during your seizures.  You keep having seizures with treatment.  You have symptoms of being sick or having an infection. Get help right away if:  You have a seizure that: ? Lasts longer than 5 minutes. ? Is different than seizures you had before. ? Makes it harder to breathe. ? Happens after you hurt your head.  You have any of these symptoms after a seizure: ? Not being able to speak. ? Not being able to use a part of your body. ? Confusion. ? A bad headache.  You have two or more seizures in a row.  You do not wake up right after a seizure.  You get hurt during a seizure. These symptoms may be an emergency. Do not wait to see if the symptoms will go away. Get medical help right away. Call your local emergency services (911 in the [place] Do not drive yourself to the hospital. Summary  Seizures usually last from 30 seconds to 2 minutes. Usually, they are not harmful unless they last a long time.  Do not eat or drink anything that may keep your medicine from working, such as alcohol.  Teach friends and family what to do when you have a seizure.  Contact your doctor each time you have a seizure. This information is not intended to replace advice given to you by your health care provider. Make sure you discuss any questions you have with your health care provider. Document Revised: 11/10/2018 Document Reviewed: 11/10/2018 Elsevier Patient Education  [place].

## 2020-01-07 NOTE — Progress Notes (Signed)
I agree with the above plan 

## 2020-03-27 DIAGNOSIS — H5340 Unspecified visual field defects: Secondary | ICD-10-CM | POA: Insufficient documentation

## 2020-04-02 ENCOUNTER — Telehealth: Payer: Self-pay | Admitting: Family Medicine

## 2020-04-02 NOTE — Telephone Encounter (Signed)
I called pt and he noted 3 episodes of nausea, R visual blindspot, migraine.  We have not pt for this issues.  Relayed to have pcp refer to [place] and see MD initially.  He will call pcp back.

## 2020-04-02 NOTE — Telephone Encounter (Signed)
Pt left a message for NP; had a migraine on Sunday that lasted til Monday afternoon, blind spot in perianal vision lat another day. Seen primary physician suggested I contact NP.

## 2020-05-28 ENCOUNTER — Ambulatory Visit: Payer: BC Managed Care – PPO | Admitting: Neurology

## 2020-05-28 ENCOUNTER — Encounter: Payer: Self-pay | Admitting: Neurology

## 2020-05-28 ENCOUNTER — Other Ambulatory Visit: Payer: Self-pay

## 2020-05-28 VITALS — BP 133/81 | HR 69 | Ht 70.0 in | Wt 171.0 lb

## 2020-05-28 DIAGNOSIS — G40909 Epilepsy, unspecified, not intractable, without status epilepticus: Secondary | ICD-10-CM | POA: Diagnosis not present

## 2020-05-28 DIAGNOSIS — H53461 Homonymous bilateral field defects, right side: Secondary | ICD-10-CM

## 2020-05-28 DIAGNOSIS — H5340 Unspecified visual field defects: Secondary | ICD-10-CM | POA: Diagnosis not present

## 2020-05-28 DIAGNOSIS — G441 Vascular headache, not elsewhere classified: Secondary | ICD-10-CM | POA: Diagnosis not present

## 2020-05-28 MED ORDER — ASPIRIN EC 81 MG PO TBEC: 81.0000 mg | DELAYED_RELEASE_TABLET | Freq: Every day | ORAL | 2 refills | Status: AC

## 2020-05-28 NOTE — Patient Instructions (Addendum)
I had a long discussion with the patient with regard to his episode of sudden onset severe headache followed by right-sided partial visual disturbance discussed differential diagnosis including migraine headaches versus left occipital infarct and recommend further evaluation with checking MRI scan of the brain, MRA of the brain and neck, lipid profile, hemoglobin A1c, ESR, ANA panel and anticardiolipin antibodies.  Also check TCD bubble study for PFO I recommend he start taking aspirin 81 mg daily.  He will continue on the current dosages of Keppra XR 500 mg daily and Dilantin ER 300 mg daily for his seizure disorder which appears stable.  He will return for follow-up in 3 months or call earlier if necessary.

## 2020-05-28 NOTE — Progress Notes (Signed)
[facility] [address] [place]. [place] [address] [phone]       OFFICE CONSULT NOTE  [name] Date of Birth:  04/11/1976 Medical Record Number:  [id number]   Referring MD: Bing Matter, PA-C  Reason for Referral: Headache  HPI: Jordan Norton is a 44 year old Caucasian male seen today for consultation visit for new onset headache and vision difficulties.  History is obtained from the patient, review of electronic medical records and I personally reviewed available pertinent imaging films in PACS.  He has past medical history of hypertension, seizures since 2016 well-controlled on Dilantin and Keppra and history of asthma.  He states that to the end of July 2020 when he had an episode of severe bad headache the morning he vomited twice and had to rest all day.  The headache he describes as moderate 7/10 in severity mostly aching but all over.  He denies any light or sound sensitivity.  There were no associated symptoms of blurred vision or focal neurological symptoms.  He rested the whole day.  Next day the headache was better and then gradually resolved.  He however started noticing visual flashes in the right upper quadrant of field of vision.  This lasted several days and subsequently has noticed some loss of vision in this area which has persisted.  He has not had any follow-up brain imaging done.  He denies any prior history of migraines but remembers one episode of somewhat similar headache he had in 2001 which was less severe and he threw up and that lasted a day but was not accompanied by visual symptoms.  He was able to work on that day and not have to lie down.  He does have family history of migraine is in his sister who has 1 or 2 such headaches a month and has some visual symptoms with it.  Patient noticed that after this current episode every time he had caffeine the visual flashes would be triggered so he cut back caffeine they stopped.  He has a prior  history of 2 episodes of seizures in August 2016 which occurred back-to-back few days apart.  He may have had previous seizures in February, March and April that year which had not been recognized.  He had 1 seizure in 2004 and saw Dr. Jannifer Franklin.  He was on Dilantin for a while which was stopped.  Dilantin was restarted following his seizures in 2016. MRI scan of the brain and EEG done in the office in September 2016 were both normal except for tiny to nonspecific white matter hyperintensities of unclear significance.  He has also remained on Keppra XR $RemoveB'500mg'[id number]$  daily in addition to Dilantin ER 300 mg daily and tolerating both medications well without side effects.  His last Dilantin level on 10/01/2019 was 8.2.  CBC and BMP done on 04/01/2020 were both unremarkable.  He has no complaints today  ROS:   14 system review of systems is positive for headache, vision flashes, vision loss, seizures and all other systems negative  PMH:  Past Medical History:  Diagnosis Date  . Asthma   . Hypertension   . Seizures ([place])     Social History:  Social History   Socioeconomic History  . Marital status: Single    Spouse name: Not on file  . Number of children: Not on file  . Years of education: 32  . Highest education level: Not on file  Occupational History  . 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 Smoking status: Never Smoker  . Smokeless tobacco: Never Used  Substance and Sexual Activity  . Alcohol use: No  . Drug use: Yes    Types: Marijuana  . Sexual activity: Never  Other Topics Concern  . Not on file  Social History Narrative   Lives alone in a home    Drinks Coffee (1 pot) a day    Social Determinants of Health   Financial Resource Strain:   . Difficulty of Paying Living Expenses: Not on file  Food Insecurity:   . Worried About Charity fundraiser in the Last Year: Not on file  . Ran Out of Food in the Last Year: Not on file  Transportation Needs:   . Lack of  Transportation (Medical): Not on file  . Lack of Transportation (Non-Medical): Not on file  Physical Activity:   . Days of Exercise per Week: Not on file  . Minutes of Exercise per Session: Not on file  Stress:   . Feeling of Stress : Not on file  Social Connections:   . Frequency of Communication with Friends and Family: Not on file  . Frequency of Social Gatherings with Friends and Family: Not on file  . Attends Religious Services: Not on file  . Active Member of Clubs or Organizations: Not on file  . Attends Archivist Meetings: Not on file  . Marital Status: Not on file  Intimate Partner Violence:   . Fear of Current or Ex-Partner: Not on file  . Emotionally Abused: Not on file  . Physically Abused: Not on file  . Sexually Abused: Not on file    Medications:   Current Outpatient Medications on File Prior to Visit  Medication Sig Dispense Refill  . albuterol (PROAIR HFA) 108 (90 BASE) MCG/ACT inhaler Inhale into the lungs.    . fluticasone (FLONASE) 50 MCG/ACT nasal spray Place into the nose.    . fluticasone (FLOVENT HFA) 44 MCG/ACT inhaler Inhale into the lungs.    . levETIRAcetam (KEPPRA XR) 500 MG 24 hr tablet Take 1 tablet (500 mg total) by mouth daily. 90 tablet 3  . loratadine (CLARITIN) 10 MG tablet Take by mouth.    . losartan (COZAAR) 50 MG tablet Take by mouth.    . pantoprazole (PROTONIX) 40 MG tablet Take 40 mg by mouth daily.  3  . phenytoin (DILANTIN) 300 MG ER capsule Take 1 tablet by mouth daily 90 capsule 3   No current facility-administered medications on file prior to visit.    Allergies:   Allergies  Allergen Reactions  . Eggs Or Egg-Derived Products     Asthma or hives or rash Cannot take Flu vaccine due to egg allergy  . Acetaminophen   . Hydrocodone-Acetaminophen Other (See Comments)    unknown  . Levofloxacin   . Penicillins     Physical Exam General: well developed, well nourished middle-aged Caucasian male, seated, in no  evident distress Head: head normocephalic and atraumatic.   Neck: supple with no carotid or supraclavicular bruits Cardiovascular: regular rate and rhythm, no murmurs Musculoskeletal: no deformity Skin:  no rash/petichiae Vascular:  Normal pulses all extremities  Neurologic Exam Mental Status: Awake and fully alert. Oriented to place and time. Recent and remote memory intact. Attention span, concentration and fund of knowledge appropriate. Mood and affect appropriate.  Cranial Nerves: Fundoscopic exam reveals sharp disc margins. Pupils equal, briskly reactive to light. Extraocular movements full without nystagmus. Visual fields full show right superior  quadrantanopsia to confrontation. Hearing intact. Facial sensation intact. Face, tongue, palate moves normally and symmetrically.  Motor: Normal bulk and tone. Normal strength in all tested extremity muscles. Sensory.: intact to touch , pinprick , position and vibratory sensation.  Coordination: Rapid alternating movements normal in all extremities. Finger-to-nose and heel-to-shin performed accurately bilaterally. Gait and Station: Arises from chair without difficulty. Stance is normal. Gait demonstrates normal stride length and balance . Able to heel, toe and tandem walk without difficulty.  Reflexes: 1+ and symmetric. Toes downgoing.   NIHSS  1 Modified Rankin  2   ASSESSMENT: 44 year old Caucasian male with episode of sudden onset of severe headache in July 2021 followed by right-sided partial peripheral vision loss possibly from left occipital infarct.  Remote history of seizures 1st in 2004 and subsequent recurrence in 2016 which appear to be well controlled on the current medication regimen of Keppra and Dilantin.     PLAN: I had a long discussion with the patient with regard to his episode of sudden onset severe headache followed by right-sided partial visual disturbance discussed differential diagnosis including migraine headaches  versus left occipital infarct and recommend further evaluation with checking MRI scan of the brain, MRA of the brain and neck, lipid profile, hemoglobin A1c, ESR, ANA panel and anticardiolipin antibodies.  Also check TCD bubble study for PFO I recommend he start taking aspirin 81 mg daily.  He will continue on the current dosages of Keppra XR 500 mg daily and Dilantin ER 300 mg daily for his seizure disorder which appears stable.  Greater than 50% time during this 45-minute consultation visit was spent on counseling and coordination of care about his headache and right-sided vision loss discussion about seizure prevention and answering questions he will return for follow-up in 3 months or call earlier if necessary. Antony Contras, MD  [facility] [address] [place], [place] [address]  Phone [phone] Fax [phone] Note: This document was prepared with digital dictation and possible smart phrase technology. Any transcriptional errors that result from this process are unintentional.

## 2020-05-29 ENCOUNTER — Telehealth: Payer: Self-pay | Admitting: Neurology

## 2020-05-29 ENCOUNTER — Telehealth: Payer: Self-pay | Admitting: *Deleted

## 2020-05-29 LAB — SEDIMENTATION RATE: Sed Rate: 2 mm/hr (ref 0–15)

## 2020-05-29 NOTE — Telephone Encounter (Signed)
no to the covid questions MR Brain w/wo contrast, MRA Head wo contrast & MRA Neck w/wo contrast Dr. Valentina Gu Auth: [id number] (exp. 05/28/20 to 11/23/20). Patient is scheduled at [facility] For 06/03/20.

## 2020-05-29 NOTE — Progress Notes (Signed)
Kindly inform the patient that not all lab work is back yet but most of which is back is fine.  His cholesterol appears to be borderline and he needs to be on a low-fat diet.

## 2020-05-29 NOTE — Telephone Encounter (Signed)
LVM informing patient that not all lab work is back yet but most of which is back is fine. His cholesterol appears to be borderline, and he needs to be on a low-fat diet.  Advised he'll get a call with other results. Left #.

## 2020-05-30 LAB — LIPID PANEL
Chol/HDL Ratio: 2.6 ratio (ref 0.0–5.0)
Cholesterol, Total: 198 mg/dL (ref 100–199)
HDL: 75 mg/dL (ref >39)
LDL Chol Calc (NIH): 109 mg/dL — ABNORMAL HIGH (ref 0–99)
Triglycerides: 78 mg/dL (ref 0–149)
VLDL Cholesterol Cal: 14 mg/dL (ref 5–40)

## 2020-05-30 LAB — HEMOGLOBIN A1C
Est. average glucose Bld gHb Est-mCnc: 94 mg/dL
Hgb A1c MFr Bld: 4.9 % (ref 4.8–5.6)

## 2020-05-30 LAB — CARDIOLIPIN ANTIBODIES, IGG, IGM, IGA
Anticardiolipin IgA: 9 APL U/mL (ref 0–11)
Anticardiolipin IgG: <9 GPL U/mL (ref 0–14)
Anticardiolipin IgM: <9 MPL U/mL (ref 0–12)

## 2020-05-30 LAB — ANA: Anti Nuclear Antibody (ANA): POSITIVE — AB

## 2020-06-03 ENCOUNTER — Ambulatory Visit (INDEPENDENT_AMBULATORY_CARE_PROVIDER_SITE_OTHER): Payer: BC Managed Care – PPO

## 2020-06-03 ENCOUNTER — Other Ambulatory Visit: Payer: Self-pay

## 2020-06-03 DIAGNOSIS — H53461 Homonymous bilateral field defects, right side: Secondary | ICD-10-CM | POA: Diagnosis not present

## 2020-06-03 DIAGNOSIS — G40909 Epilepsy, unspecified, not intractable, without status epilepticus: Secondary | ICD-10-CM | POA: Diagnosis not present

## 2020-06-03 DIAGNOSIS — G441 Vascular headache, not elsewhere classified: Secondary | ICD-10-CM | POA: Diagnosis not present

## 2020-06-03 MED ORDER — GADOBENATE DIMEGLUMINE 529 MG/ML IV SOLN
20.0000 mL | Freq: Once | INTRAVENOUS | Status: AC | PRN
  Administered 2020-06-03: 20 mL via INTRAVENOUS

## 2020-06-04 ENCOUNTER — Ambulatory Visit (HOSPITAL_COMMUNITY)
Admission: RE | Admit: 2020-06-04 | Discharge: 2020-06-04 | Disposition: A | Discharge status: Home / Self Care | Payer: BC Managed Care – PPO | Source: Ambulatory Visit | Attending: Neurology | Admitting: Neurology

## 2020-06-04 ENCOUNTER — Other Ambulatory Visit: Payer: Self-pay

## 2020-06-04 ENCOUNTER — Other Ambulatory Visit: Payer: Self-pay | Admitting: Neurology

## 2020-06-04 DIAGNOSIS — G441 Vascular headache, not elsewhere classified: Secondary | ICD-10-CM | POA: Diagnosis not present

## 2020-06-04 DIAGNOSIS — Q2112 Patent foramen ovale: Secondary | ICD-10-CM

## 2020-06-04 DIAGNOSIS — H53461 Homonymous bilateral field defects, right side: Secondary | ICD-10-CM

## 2020-06-04 MED ORDER — ATORVASTATIN CALCIUM 40 MG PO TABS: 40.0000 mg | ORAL_TABLET | Freq: Every day | ORAL | 11 refills | Status: DC

## 2020-06-04 NOTE — Progress Notes (Signed)
TCD with bubbles has been completed. Preliminary results can be found in CV Proc through chart review.   06/04/20 2:40 PM Jordan Norton RVT

## 2020-06-09 NOTE — Progress Notes (Signed)
I personally informed the patient about the results of MRA at the time of doing his transcranial Doppler bubble study

## 2020-06-09 NOTE — Progress Notes (Signed)
Patient was informed about the study results at the time of the procedure

## 2020-06-09 NOTE — Progress Notes (Signed)
I personally informed the patient the results of the MRA of the neck at the time of doing transcranial Doppler bubble study

## 2020-06-09 NOTE — Progress Notes (Signed)
I have already informed the patient about the results of the MRI scan at the time of performing the transcranial Doppler bubble study in person and further stroke work-up has been ordered

## 2020-06-10 ENCOUNTER — Other Ambulatory Visit: Payer: Self-pay | Admitting: Emergency Medicine

## 2020-06-10 ENCOUNTER — Telehealth: Payer: Self-pay | Admitting: Neurology

## 2020-06-10 NOTE — Telephone Encounter (Signed)
Pt called, Do I still need to have a referral for a cardiologist? The pharmacy do not have the prescription for atorvastatin (LIPITOR) 40 MG tablet. Would like a call from the nurse.

## 2020-06-11 ENCOUNTER — Other Ambulatory Visit: Payer: Self-pay | Admitting: Neurology

## 2020-06-11 ENCOUNTER — Other Ambulatory Visit: Payer: Self-pay | Admitting: Emergency Medicine

## 2020-06-11 DIAGNOSIS — Q2112 Patent foramen ovale: Secondary | ICD-10-CM

## 2020-06-11 MED ORDER — ATORVASTATIN CALCIUM 40 MG PO TABS: 40.0000 mg | ORAL_TABLET | Freq: Every day | ORAL | 11 refills | Status: DC

## 2020-06-11 NOTE — Telephone Encounter (Signed)
Yes I will refer him to Dr Burt Knack

## 2020-06-12 ENCOUNTER — Telehealth: Payer: Self-pay | Admitting: Emergency Medicine

## 2020-06-12 ENCOUNTER — Other Ambulatory Visit: Payer: Self-pay | Admitting: Emergency Medicine

## 2020-06-12 NOTE — Telephone Encounter (Signed)
Patient requested call back regarding Lipitor prescription and referral to cardiology.  Called and left message (on DPR) for patient that the Lipitor was resent to the pharmacy and should be available for pick up as well as Dr. Leonie Man referred him to Dr. Burt Knack for cardiology consult.  Left office number [phone] to call back if he had any further questions.

## 2020-06-23 ENCOUNTER — Encounter: Payer: Self-pay | Admitting: Cardiovascular Disease

## 2020-06-23 ENCOUNTER — Ambulatory Visit: Payer: BC Managed Care – PPO | Admitting: Cardiovascular Disease

## 2020-06-23 ENCOUNTER — Other Ambulatory Visit: Payer: Self-pay

## 2020-06-23 VITALS — BP 130/82 | HR 61 | Ht 70.0 in | Wt 174.0 lb

## 2020-06-23 DIAGNOSIS — Q2112 Patent foramen ovale: Secondary | ICD-10-CM

## 2020-06-23 DIAGNOSIS — Q211 Atrial septal defect: Secondary | ICD-10-CM

## 2020-06-23 LAB — BASIC METABOLIC PANEL
BUN/Creatinine Ratio: 11 (ref 9–20)
BUN: 8 mg/dL (ref 6–24)
CO2: 29 mmol/L (ref 20–29)
Calcium: 9.2 mg/dL (ref 8.7–10.2)
Chloride: 104 mmol/L (ref 96–106)
Creatinine, Ser: 0.73 mg/dL — ABNORMAL LOW (ref 0.76–1.27)
GFR calc Af Amer: 131 mL/min/{1.73_m2} (ref >59)
GFR calc non Af Amer: 114 mL/min/{1.73_m2} (ref >59)
Glucose: 96 mg/dL (ref 65–99)
Potassium: 4.2 mmol/L (ref 3.5–5.2)
Sodium: 144 mmol/L (ref 134–144)

## 2020-06-23 LAB — CBC WITH DIFFERENTIAL/PLATELET
Basophils Absolute: 0 10*3/uL (ref 0.0–0.2)
Basos: 1 %
EOS (ABSOLUTE): 0.1 10*3/uL (ref 0.0–0.4)
Eos: 4 %
Hematocrit: 44.8 % (ref 37.5–51.0)
Hemoglobin: 15.7 g/dL (ref 13.0–17.7)
Immature Grans (Abs): 0 10*3/uL (ref 0.0–0.1)
Immature Granulocytes: 0 %
Lymphocytes Absolute: 1.3 10*3/uL (ref 0.7–3.1)
Lymphs: 34 %
MCH: 33.1 pg — ABNORMAL HIGH (ref 26.6–33.0)
MCHC: 35 g/dL (ref 31.5–35.7)
MCV: 95 fL (ref 79–97)
Monocytes Absolute: 0.3 10*3/uL (ref 0.1–0.9)
Monocytes: 7 %
Neutrophils Absolute: 2.1 10*3/uL (ref 1.4–7.0)
Neutrophils: 54 %
Platelets: 305 10*3/uL (ref 150–450)
RBC: 4.74 x10E6/uL (ref 4.14–5.80)
RDW: 11.8 % (ref 11.6–15.4)
WBC: 3.9 10*3/uL (ref 3.4–10.8)

## 2020-06-23 NOTE — Progress Notes (Signed)
[facility]   STRUCTURAL HEART TEAM  Date:  06/23/2020   ID:  Jordan Norton, DOB [birth year], MRN [id number]  PCP:  Aletha Halim., PA-C   Chief Complaint  Patient presents with   PFO     HISTORY OF PRESENT ILLNESS: Jordan Norton is a 44 y.o. male who presents for evaluation of PFO management, referred by Dr Leonie Man.   The patient underwent outpatient neurology evaluation 05/28/2020 when he was seen for headache and visual disturbance.  He is known to have a seizure disorder since 2016 that has been well controlled on Dilantin and Keppra. He hasn't had a single seizure since 2016 when the second medication was added. He had an episode in July 2021 with severe headache and visual disturbance. He lost a portion of his peripheral vision on the right side and this has not improved. He was referred back to see Dr Leonie Man at that time. An MRA of the neck was normal. 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 A transcranial doppler study showed partial curtain effect consistent with a moderate PFO.   The patient is here alone today. He denies any history of known cardiovascular problems. He did have some issues with heart palpitations several years ago when he was evaluated by a cardiologist. A stress test was done and was reportedly normal. No chest pain, chest pressure with exertion. No shortness of breath. No recent palpitations.   The patient denies any history of nickel allergy or reaction.  Past Medical History:  Diagnosis Date   Asthma    Hypertension    Seizures ([place])     Current Outpatient Medications  Medication Sig Dispense Refill   albuterol (PROAIR HFA) 108 (90 BASE) MCG/ACT inhaler Inhale into the lungs.     aspirin EC 81 MG tablet Take 1 tablet (81 mg total) by mouth daily. Swallow whole. 150 tablet 2   atorvastatin (LIPITOR) 40 MG tablet Take 1 tablet (40 mg total) by mouth daily. 30 tablet 11   fluticasone (FLONASE) 50 MCG/ACT nasal spray Place  into the nose.     fluticasone (FLOVENT HFA) 44 MCG/ACT inhaler Inhale into the lungs.     levETIRAcetam (KEPPRA XR) 500 MG 24 hr tablet Take 1 tablet (500 mg total) by mouth daily. 90 tablet 3   loratadine (CLARITIN) 10 MG tablet Take by mouth.     losartan (COZAAR) 50 MG tablet Take by mouth.     pantoprazole (PROTONIX) 40 MG tablet Take 40 mg by mouth daily.  3   phenytoin (DILANTIN) 300 MG ER capsule Take 1 tablet by mouth daily 90 capsule 3   No current facility-administered medications for this visit.    ALLERGIES:   Eggs or egg-derived products, Acetaminophen, Hydrocodone-acetaminophen, Levofloxacin, and Penicillins   SOCIAL HISTORY:  The patient  reports that he has never smoked. He has never used smokeless tobacco. He reports current drug use. Drug: Marijuana. He reports that he does not drink alcohol.   FAMILY HISTORY:  The patient's family history includes Cancer in his maternal uncle and mother; Diabetes in his paternal grandmother; Heart disease in his maternal grandfather; Hypertension in his father; Parkinsonism in his maternal grandmother and mother.   REVIEW OF SYSTEMS:  Positive for seizures.   All other systems are reviewed and negative.   PHYSICAL EXAM: VS:  BP 130/82    Pulse 61    Ht 5\' 10"  (1.778 m)    Wt 174 lb (78.9 kg)    SpO2  98%    BMI 24.97 kg/m  , BMI Body mass index is 24.97 kg/m. GEN: Well nourished, well developed, in no acute distress HEENT: normal Neck: No JVD. carotids 2+ without bruits or masses Cardiac: The heart is RRR without murmurs, rubs, or gallops. No edema. Pedal pulses 2+ = bilaterally  Respiratory:  clear to auscultation bilaterally GI: soft, nontender, nondistended, + BS MS: no deformity or atrophy Skin: warm and dry, no rash Neuro:  Strength and sensation are intact Psych: euthymic mood, full affect  EKG:  EKG from today reviewed and demonstrates NSR 61 bpm, nonspecific ST abnormality  RECENT LABS: 10/01/2019: ALT 25; BUN 6;  Creatinine, Ser 0.65; Hemoglobin 15.6; Platelets 340; Potassium 4.4; Sodium 142  05/28/2020: Chol/HDL Ratio 2.6; Cholesterol, Total 198; HDL 75; LDL Chol Calc (NIH) 109; Triglycerides 78   CrCl cannot be calculated (Patient's most recent lab result is older than the maximum 21 days allowed.).   Wt Readings from Last 3 Encounters:  06/23/20 174 lb (78.9 kg)  05/28/20 171 lb (77.6 kg)  01/02/20 164 lb 3.2 oz (74.5 kg)     PERTINENT STUDIES:  Transcranial Doppler:  Summary:     A vascular evaluation was performed. The right middle cerebral artery was  studied. An IV was inserted into the patient's right cephalic. Verbal  informed consent was obtained.    A partial curtain effect was noted, which is indicative of a moderate PFO.    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  This is a new finding compared to MRI in 2016. -No acute findings.   ASSESSMENT AND PLAN: 1.  PFO: Patient is found to have a PFO based on right to left shunting at transcranial Doppler.  This is found in the context of his presentation with headache and visual field loss, MRI demonstrating a chronic left thalamic infarct.  The patient's Rope Score is 8, indicating a high probability that the patient's stroke is 'PFO-related.'  The patient understands that there are a variety of mechanisms that could lead to right to left shunting.  Possibilities include PFO, ASD, pulmonary arteriovenous malformations, or other cardiac defects.  In order to better define his cardiac anatomy, I have recommended a transesophageal echocardiogram.  This can evaluate the anatomy of the interatrial septum and determine presence of high risk features such as atrial septal aneurysm or large PFO size.  I have reviewed risks, indications, and alternatives to transesophageal echo with the patient.  He understands and agrees to proceed.  He understands that based  on transesophageal echo results, he could be considered for transcatheter PFO closure.  The patient is counseled about the association of PFO and cryptogenic stroke. Available clinical trial data is reviewed, specifically those trials comparing transcatheter PFO closure and medical therapy with antiplatelet drugs. The patient understands the potential benefit of PFO closure with respect to secondary stroke reduction compared with medical therapy alone. Specific risks of transcatheter PFO closure are reviewed with the patient. These risks include bleeding, infection, device embolization, stroke, cardiac perforation, tamponade, arrhythmia, MI, and late device erosion. He understands these serious risks occur at low incidence of < 1%. PFO closure devices are demonstrated to the patient today in the office and procedural steps are reviewed. I will contact him after the TEE is completed to discuss whether transcatheter PFO closure is indicated/recommended.   Sherren Mocha 06/23/2020 9:14 AM

## 2020-06-23 NOTE — H&P (View-Only) (Signed)
[facility]   STRUCTURAL HEART TEAM  Date:  06/23/2020   ID:  Jordan Norton, DOB [birth year], MRN [id number]  PCP:  Aletha Halim., PA-C   Chief Complaint  Patient presents with  . PFO     HISTORY OF PRESENT ILLNESS: Jordan Norton is a 44 y.o. male who presents for evaluation of PFO management, referred by Dr Leonie Man.   The patient underwent outpatient neurology evaluation 05/28/2020 when he was seen for headache and visual disturbance.  He is known to have a seizure disorder since 2016 that has been well controlled on Dilantin and Keppra. He hasn't had a single seizure since 2016 when the second medication was added. He had an episode in July 2021 with severe headache and visual disturbance. He lost a portion of his peripheral vision on the right side and this has not improved. He was referred back to see Dr Leonie Man at that time. An MRA of the neck was normal. 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 A transcranial doppler study showed partial curtain effect consistent with a moderate PFO.   The patient is here alone today. He denies any history of known cardiovascular problems. He did have some issues with heart palpitations several years ago when he was evaluated by a cardiologist. A stress test was done and was reportedly normal. No chest pain, chest pressure with exertion. No shortness of breath. No recent palpitations.   The patient denies any history of nickel allergy or reaction.  Past Medical History:  Diagnosis Date  . Asthma   . Hypertension   . Seizures ([place])     Current Outpatient Medications  Medication Sig Dispense Refill  . albuterol (PROAIR HFA) 108 (90 BASE) MCG/ACT inhaler Inhale into the lungs.    Marland Kitchen aspirin EC 81 MG tablet Take 1 tablet (81 mg total) by mouth daily. Swallow whole. 150 tablet 2  . atorvastatin (LIPITOR) 40 MG tablet Take 1 tablet (40 mg total) by mouth daily. 30 tablet 11  . fluticasone (FLONASE) 50 MCG/ACT nasal spray Place  into the nose.    . fluticasone (FLOVENT HFA) 44 MCG/ACT inhaler Inhale into the lungs.    . levETIRAcetam (KEPPRA XR) 500 MG 24 hr tablet Take 1 tablet (500 mg total) by mouth daily. 90 tablet 3  . loratadine (CLARITIN) 10 MG tablet Take by mouth.    . losartan (COZAAR) 50 MG tablet Take by mouth.    . pantoprazole (PROTONIX) 40 MG tablet Take 40 mg by mouth daily.  3  . phenytoin (DILANTIN) 300 MG ER capsule Take 1 tablet by mouth daily 90 capsule 3   No current facility-administered medications for this visit.    ALLERGIES:   Eggs or egg-derived products, Acetaminophen, Hydrocodone-acetaminophen, Levofloxacin, and Penicillins   SOCIAL HISTORY:  The patient  reports that he has never smoked. He has never used smokeless tobacco. He reports current drug use. Drug: Marijuana. He reports that he does not drink alcohol.   FAMILY HISTORY:  The patient's family history includes Cancer in his maternal uncle and mother; Diabetes in his paternal grandmother; Heart disease in his maternal grandfather; Hypertension in his father; Parkinsonism in his maternal grandmother and mother.   REVIEW OF SYSTEMS:  Positive for seizures.   All other systems are reviewed and negative.   PHYSICAL EXAM: VS:  BP 130/82   Pulse 61   Ht 5\' 10"  (1.778 m)   Wt 174 lb (78.9 kg)   SpO2 98%   BMI  24.97 kg/m  , BMI Body mass index is 24.97 kg/m. GEN: Well nourished, well developed, in no acute distress HEENT: normal Neck: No JVD. carotids 2+ without bruits or masses Cardiac: The heart is RRR without murmurs, rubs, or gallops. No edema. Pedal pulses 2+ = bilaterally  Respiratory:  clear to auscultation bilaterally GI: soft, nontender, nondistended, + BS MS: no deformity or atrophy Skin: warm and dry, no rash Neuro:  Strength and sensation are intact Psych: euthymic mood, full affect  EKG:  EKG from today reviewed and demonstrates NSR 61 bpm, nonspecific ST abnormality  RECENT LABS: 10/01/2019: ALT 25; BUN 6;  Creatinine, Ser 0.65; Hemoglobin 15.6; Platelets 340; Potassium 4.4; Sodium 142  05/28/2020: Chol/HDL Ratio 2.6; Cholesterol, Total 198; HDL 75; LDL Chol Calc (NIH) 109; Triglycerides 78   CrCl cannot be calculated (Patient's most recent lab result is older than the maximum 21 days allowed.).   Wt Readings from Last 3 Encounters:  06/23/20 174 lb (78.9 kg)  05/28/20 171 lb (77.6 kg)  01/02/20 164 lb 3.2 oz (74.5 kg)     PERTINENT STUDIES:  Transcranial Doppler:  Summary:     A vascular evaluation was performed. The right middle cerebral artery was  studied. An IV was inserted into the patient's right cephalic. Verbal  informed consent was obtained.    A partial curtain effect was noted, which is indicative of a moderate PFO.    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  This is a new finding compared to MRI in 2016. -No acute findings.   ASSESSMENT AND PLAN: 1.  PFO: Patient is found to have a PFO based on right to left shunting at transcranial Doppler.  This is found in the context of his presentation with headache and visual field loss, MRI demonstrating a chronic left thalamic infarct.  The patient's Rope Score is 8, indicating a high probability that the patient's stroke is 'PFO-related.'  The patient understands that there are a variety of mechanisms that could lead to right to left shunting.  Possibilities include PFO, ASD, pulmonary arteriovenous malformations, or other cardiac defects.  In order to better define his cardiac anatomy, I have recommended a transesophageal echocardiogram.  This can evaluate the anatomy of the interatrial septum and determine presence of high risk features such as atrial septal aneurysm or large PFO size.  I have reviewed risks, indications, and alternatives to transesophageal echo with the patient.  He understands and agrees to proceed.  He understands that based  on transesophageal echo results, he could be considered for transcatheter PFO closure.  The patient is counseled about the association of PFO and cryptogenic stroke. Available clinical trial data is reviewed, specifically those trials comparing transcatheter PFO closure and medical therapy with antiplatelet drugs. The patient understands the potential benefit of PFO closure with respect to secondary stroke reduction compared with medical therapy alone. Specific risks of transcatheter PFO closure are reviewed with the patient. These risks include bleeding, infection, device embolization, stroke, cardiac perforation, tamponade, arrhythmia, MI, and late device erosion. He understands these serious risks occur at low incidence of < 1%. PFO closure devices are demonstrated to the patient today in the office and procedural steps are reviewed. I will contact him after the TEE is completed to discuss whether transcatheter PFO closure is indicated/recommended.   Sherren Mocha 06/23/2020 9:14 AM

## 2020-06-23 NOTE — Patient Instructions (Signed)
COVID SCREENING INFORMATION (10/25): You are scheduled for your drive-thru COVID screening on 06/30/2020 between 9AM and 10AM. Pre-Procedural COVID-19 Testing Site [address] [place], [place] [address] You will need to go home after your screening and quarantine until your procedure.    TEE INSTRUCTIONS (10/26): You are scheduled for a TEE on 07/01/2020 with Dr. Gardiner Rhyme.  Please arrive at the [facility] (Main Entrance A) at [facility]: [address] [place], [place] [phone] at [place] are allowed ONE visitor in the waiting room during your procedure. Both youy and your guest must wear masks.  DIET: Nothing to eat or drink after midnight except a sip of water with medications.  Medication Instructions:  1) You may take your medications as instructed with small sips of water  Labs: TODAY! BMET, CBC  You must have a responsible person to drive you home and stay in the waiting area during your procedure. Failure to do so could result in cancellation.  Bring your insurance cards.  *Special Note: Every effort is made to have your procedure done on time. Occasionally there are emergencies that occur at the hospital that may cause delays. Please be patient if a delay does occur.

## 2020-06-23 NOTE — H&P (View-Only) (Signed)
[facility]   STRUCTURAL HEART TEAM  Date:  06/23/2020   ID:  Jordan Norton, DOB 18-Oct-1975, MRN [id number]  PCP:  Aletha Halim., PA-C   Chief Complaint  Patient presents with  . PFO     HISTORY OF PRESENT ILLNESS: Jordan Norton is a 45 y.o. male who presents for evaluation of PFO management, referred by Dr Leonie Man.   The patient underwent outpatient neurology evaluation 05/28/2020 when he was seen for headache and visual disturbance.  He is known to have a seizure disorder since 2016 that has been well controlled on Dilantin and Keppra. He hasn't had a single seizure since 2016 when the second medication was added. He had an episode in July 2021 with severe headache and visual disturbance. He lost a portion of his peripheral vision on the right side and this has not improved. He was referred back to see Dr Leonie Man at that time. An MRA of the neck was normal. 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 A transcranial doppler study showed partial curtain effect consistent with a moderate PFO.   The patient is here alone today. He denies any history of known cardiovascular problems. He did have some issues with heart palpitations several years ago when he was evaluated by a cardiologist. A stress test was done and was reportedly normal. No chest pain, chest pressure with exertion. No shortness of breath. No recent palpitations.   The patient denies any history of nickel allergy or reaction.  Past Medical History:  Diagnosis Date  . Asthma   . Hypertension   . Seizures ([place])     Current Outpatient Medications  Medication Sig Dispense Refill  . albuterol (PROAIR HFA) 108 (90 BASE) MCG/ACT inhaler Inhale into the lungs.    Marland Kitchen aspirin EC 81 MG tablet Take 1 tablet (81 mg total) by mouth daily. Swallow whole. 150 tablet 2  . atorvastatin (LIPITOR) 40 MG tablet Take 1 tablet (40 mg total) by mouth daily. 30 tablet 11  . fluticasone (FLONASE) 50 MCG/ACT nasal spray Place  into the nose.    . fluticasone (FLOVENT HFA) 44 MCG/ACT inhaler Inhale into the lungs.    . levETIRAcetam (KEPPRA XR) 500 MG 24 hr tablet Take 1 tablet (500 mg total) by mouth daily. 90 tablet 3  . loratadine (CLARITIN) 10 MG tablet Take by mouth.    . losartan (COZAAR) 50 MG tablet Take by mouth.    . pantoprazole (PROTONIX) 40 MG tablet Take 40 mg by mouth daily.  3  . phenytoin (DILANTIN) 300 MG ER capsule Take 1 tablet by mouth daily 90 capsule 3   No current facility-administered medications for this visit.    ALLERGIES:   Eggs or egg-derived products, Acetaminophen, Hydrocodone-acetaminophen, Levofloxacin, and Penicillins   SOCIAL HISTORY:  The patient  reports that he has never smoked. He has never used smokeless tobacco. He reports current drug use. Drug: Marijuana. He reports that he does not drink alcohol.   FAMILY HISTORY:  The patient's family history includes Cancer in his maternal uncle and mother; Diabetes in his paternal grandmother; Heart disease in his maternal grandfather; Hypertension in his father; Parkinsonism in his maternal grandmother and mother.   REVIEW OF SYSTEMS:  Positive for seizures.   All other systems are reviewed and negative.   PHYSICAL EXAM: VS:  BP 130/82   Pulse 61   Ht 5\' 10"  (1.778 m)   Wt 174 lb (78.9 kg)   SpO2 98%   BMI  24.97 kg/m  , BMI Body mass index is 24.97 kg/m. GEN: Well nourished, well developed, in no acute distress HEENT: normal Neck: No JVD. carotids 2+ without bruits or masses Cardiac: The heart is RRR without murmurs, rubs, or gallops. No edema. Pedal pulses 2+ = bilaterally  Respiratory:  clear to auscultation bilaterally GI: soft, nontender, nondistended, + BS MS: no deformity or atrophy Skin: warm and dry, no rash Neuro:  Strength and sensation are intact Psych: euthymic mood, full affect  EKG:  EKG from today reviewed and demonstrates NSR 61 bpm, nonspecific ST abnormality  RECENT LABS: 10/01/2019: ALT 25; BUN 6;  Creatinine, Ser 0.65; Hemoglobin 15.6; Platelets 340; Potassium 4.4; Sodium 142  05/28/2020: Chol/HDL Ratio 2.6; Cholesterol, Total 198; HDL 75; LDL Chol Calc (NIH) 109; Triglycerides 78   CrCl cannot be calculated (Patient's most recent lab result is older than the maximum 21 days allowed.).   Wt Readings from Last 3 Encounters:  06/23/20 174 lb (78.9 kg)  05/28/20 171 lb (77.6 kg)  01/02/20 164 lb 3.2 oz (74.5 kg)     PERTINENT STUDIES:  Transcranial Doppler:  Summary:     A vascular evaluation was performed. The right middle cerebral artery was  studied. An IV was inserted into the patient's right cephalic. Verbal  informed consent was obtained.    A partial curtain effect was noted, which is indicative of a moderate PFO.    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  This is a new finding compared to MRI in 2016. -No acute findings.   ASSESSMENT AND PLAN: 1.  PFO: Patient is found to have a PFO based on right to left shunting at transcranial Doppler.  This is found in the context of his presentation with headache and visual field loss, MRI demonstrating a chronic left thalamic infarct.  The patient's Rope Score is 8, indicating a high probability that the patient's stroke is 'PFO-related.'  The patient understands that there are a variety of mechanisms that could lead to right to left shunting.  Possibilities include PFO, ASD, pulmonary arteriovenous malformations, or other cardiac defects.  In order to better define his cardiac anatomy, I have recommended a transesophageal echocardiogram.  This can evaluate the anatomy of the interatrial septum and determine presence of high risk features such as atrial septal aneurysm or large PFO size.  I have reviewed risks, indications, and alternatives to transesophageal echo with the patient.  He understands and agrees to proceed.  He understands that based  on transesophageal echo results, he could be considered for transcatheter PFO closure.  The patient is counseled about the association of PFO and cryptogenic stroke. Available clinical trial data is reviewed, specifically those trials comparing transcatheter PFO closure and medical therapy with antiplatelet drugs. The patient understands the potential benefit of PFO closure with respect to secondary stroke reduction compared with medical therapy alone. Specific risks of transcatheter PFO closure are reviewed with the patient. These risks include bleeding, infection, device embolization, stroke, cardiac perforation, tamponade, arrhythmia, MI, and late device erosion. He understands these serious risks occur at low incidence of < 1%. PFO closure devices are demonstrated to the patient today in the office and procedural steps are reviewed. I will contact him after the TEE is completed to discuss whether transcatheter PFO closure is indicated/recommended.   Sherren Mocha 06/23/2020 9:14 AM

## 2020-06-30 ENCOUNTER — Telehealth: Payer: Self-pay | Admitting: Cardiovascular Disease

## 2020-06-30 ENCOUNTER — Other Ambulatory Visit: Payer: Self-pay | Admitting: Cardiovascular Disease

## 2020-06-30 ENCOUNTER — Other Ambulatory Visit (HOSPITAL_COMMUNITY)
Admission: RE | Admit: 2020-06-30 | Discharge: 2020-06-30 | Disposition: A | Discharge status: Home / Self Care | Payer: BC Managed Care – PPO | Source: Ambulatory Visit | Attending: Cardiology | Admitting: Cardiology

## 2020-06-30 DIAGNOSIS — Z01812 Encounter for preprocedural laboratory examination: Secondary | ICD-10-CM | POA: Insufficient documentation

## 2020-06-30 DIAGNOSIS — Z20822 Contact with and (suspected) exposure to covid-19: Secondary | ICD-10-CM | POA: Insufficient documentation

## 2020-06-30 DIAGNOSIS — Q2112 Patent foramen ovale: Secondary | ICD-10-CM

## 2020-06-30 DIAGNOSIS — Q211 Atrial septal defect: Secondary | ICD-10-CM

## 2020-06-30 LAB — SARS CORONAVIRUS 2 (TAT 6-24 HRS): SARS Coronavirus 2: NEGATIVE

## 2020-06-30 NOTE — Telephone Encounter (Signed)
To Dr. Burt Knack for orders.

## 2020-06-30 NOTE — Telephone Encounter (Signed)
Butch Penny from [facility] called and said there are no orders in the system for "TEE". Please call at [phone]

## 2020-07-01 ENCOUNTER — Ambulatory Visit (HOSPITAL_COMMUNITY): Payer: BC Managed Care – PPO | Admitting: Anesthesiology

## 2020-07-01 ENCOUNTER — Ambulatory Visit (HOSPITAL_BASED_OUTPATIENT_CLINIC_OR_DEPARTMENT_OTHER): Payer: BC Managed Care – PPO

## 2020-07-01 ENCOUNTER — Encounter (HOSPITAL_COMMUNITY): Payer: Self-pay | Admitting: Cardiology

## 2020-07-01 ENCOUNTER — Other Ambulatory Visit: Payer: Self-pay

## 2020-07-01 ENCOUNTER — Ambulatory Visit (HOSPITAL_COMMUNITY)
Admission: RE | Admit: 2020-07-01 | Discharge: 2020-07-01 | Disposition: A | Discharge status: Home / Self Care | Payer: BC Managed Care – PPO | Attending: Cardiology | Admitting: Cardiology

## 2020-07-01 ENCOUNTER — Encounter (HOSPITAL_COMMUNITY)
Admission: RE | Disposition: A | Discharge status: Home / Self Care | Payer: Self-pay | Source: Home / Self Care | Attending: Cardiology

## 2020-07-01 DIAGNOSIS — Z885 Allergy status to narcotic agent status: Secondary | ICD-10-CM | POA: Diagnosis not present

## 2020-07-01 DIAGNOSIS — Q2112 Patent foramen ovale: Secondary | ICD-10-CM

## 2020-07-01 DIAGNOSIS — Q211 Atrial septal defect: Secondary | ICD-10-CM

## 2020-07-01 DIAGNOSIS — Z880 Allergy status to penicillin: Secondary | ICD-10-CM | POA: Diagnosis not present

## 2020-07-01 DIAGNOSIS — Z881 Allergy status to other antibiotic agents status: Secondary | ICD-10-CM | POA: Insufficient documentation

## 2020-07-01 HISTORY — PX: BUBBLE STUDY: SHX6837

## 2020-07-01 HISTORY — PX: TEE WITHOUT CARDIOVERSION: SHX5443

## 2020-07-01 SURGERY — ECHOCARDIOGRAM, TRANSESOPHAGEAL
Anesthesia: Monitor Anesthesia Care

## 2020-07-01 MED ORDER — LACTATED RINGERS IV SOLN: INTRAVENOUS | Status: DC | PRN

## 2020-07-01 MED ORDER — PROPOFOL 10 MG/ML IV BOLUS
INTRAVENOUS | Status: DC | PRN
  Administered 2020-07-01 (×2): 40 mg via INTRAVENOUS
  Administered 2020-07-01: 60 mg via INTRAVENOUS

## 2020-07-01 MED ORDER — SODIUM CHLORIDE 0.9 % IV SOLN: INTRAVENOUS | Status: DC

## 2020-07-01 MED ORDER — DIPHENHYDRAMINE HCL 50 MG/ML IJ SOLN
INTRAMUSCULAR | Status: DC | PRN
  Administered 2020-07-01: 12.5 mg via INTRAVENOUS

## 2020-07-01 MED ORDER — PROPOFOL 500 MG/50ML IV EMUL
INTRAVENOUS | Status: DC | PRN
  Administered 2020-07-01: 100 ug/kg/min via INTRAVENOUS

## 2020-07-01 MED ORDER — DIPHENHYDRAMINE HCL 50 MG/ML IJ SOLN
INTRAMUSCULAR | Status: AC
  Filled 2020-07-01: qty 1

## 2020-07-01 MED ORDER — LIDOCAINE 2% (20 MG/ML) 5 ML SYRINGE
INTRAMUSCULAR | Status: DC | PRN
  Administered 2020-07-01: 100 mg via INTRAVENOUS

## 2020-07-01 NOTE — Interval H&P Note (Signed)
History and Physical Interval Note:  07/01/2020 9:22 AM  Jordan Norton  has presented today for surgery, with the diagnosis of PFO.  The various methods of treatment have been discussed with the patient and family. After consideration of risks, benefits and other options for treatment, the patient has consented to  Procedure(s): TRANSESOPHAGEAL ECHOCARDIOGRAM (TEE) (N/A) as a surgical intervention.  The patient's history has been reviewed, patient examined, no change in status, stable for surgery.  I have reviewed the patient's chart and labs.  Questions were answered to the patient's satisfaction.     Donato Heinz

## 2020-07-01 NOTE — Discharge Instructions (Signed)
Transesophageal Echocardiogram Transesophageal echocardiogram (TEE) is a test that uses sound waves to take pictures of your heart. TEE is done by passing a flexible tube down the esophagus. The esophagus is the tube that carries food from the throat to the stomach. The pictures give detailed images of your heart. This can help your doctor see if there are problems with your heart. What happens before the procedure? Staying hydrated Follow instructions from your doctor about hydration, which may include:  Up to 3 hours before the procedure - you may continue to drink clear liquids, such as: ? Water. ? Clear fruit juice. ? Black coffee. ? Plain tea.  Eating and drinking Follow instructions from your doctor about eating and drinking, which may include:  8 hours before the procedure - stop eating heavy meals or foods such as meat, fried foods, or fatty foods.  6 hours before the procedure - stop eating light meals or foods, such as toast or cereal.  6 hours before the procedure - stop drinking milk or drinks that contain milk.  3 hours before the procedure - stop drinking clear liquids. General instructions  You will need to take out any dentures or retainers.  Plan to have someone take you home from the hospital or clinic.  If you will be going home right after the procedure, plan to have someone with you for 24 hours.  Ask your doctor about: ? Changing or stopping your normal medicines. This is important if you take diabetes medicines or blood thinners. ? Taking over-the-counter medicines, vitamins, herbs, and supplements. ? Taking medicines such as aspirin and ibuprofen. These medicines can thin your blood. Do not take these medicines unless your doctor tells you to take them. What happens during the procedure?  To lower your risk of infection, your doctors will wash or clean their hands.  An IV will be put into one of your veins.  You will be given a medicine to help you  relax (sedative).  A medicine may be sprayed or gargled. This numbs the back of your throat.  Your blood pressure, heart rate, and breathing will be watched.  You may be asked to lay on your left side.  A bite block will be placed in your mouth. This keeps you from biting the tube.  The tip of the TEE probe will be placed into the back of your mouth.  You will be asked to swallow.  Your doctor will take pictures of your heart.  The probe and bite block will be taken out. The procedure may vary among doctors and hospitals. What happens after the procedure?   Your blood pressure, heart rate, breathing rate, and blood oxygen level will be watched until the medicines you were given have worn off.  When you first wake up, your throat may feel sore and numb. This will get better over time. You will not be allowed to eat or drink until the numbness has gone away.  Do not drive for 24 hours if you were given a medicine to help you relax. Summary  TEE is a test that uses sound waves to take pictures of your heart.  You will be given a medicine to help you relax.  Do not drive for 24 hours if you were given a medicine to help you relax. This information is not intended to replace advice given to you by your health care provider. Make sure you discuss any questions you have with your health care provider. Document Revised:   05/12/2018 Document Reviewed: 11/24/2016 Elsevier Patient Education  2020 [employer]  

## 2020-07-01 NOTE — Anesthesia Preprocedure Evaluation (Signed)
Anesthesia Evaluation  Patient identified by MRN, date of birth, ID band Patient awake    Reviewed: Patient's Chart, lab work & pertinent test results  Airway Mallampati: II  TM Distance: >3 FB Neck ROM: Full    Dental  (+) Teeth Intact, Chipped   Pulmonary asthma ,    Pulmonary exam normal        Cardiovascular hypertension, Pt. on medications  Rhythm:Regular Rate:Normal  PFO here for TEE   Neuro/Psych Seizures -, Well Controlled,  negative psych ROS   GI/Hepatic Neg liver ROS, GERD  Medicated,  Endo/Other  negative endocrine ROS  Renal/GU negative Renal ROS  negative genitourinary   Musculoskeletal negative musculoskeletal ROS (+)   Abdominal (+)  Abdomen: soft. Bowel sounds: normal.  Peds  Hematology negative hematology ROS (+)   Anesthesia Other Findings   Reproductive/Obstetrics negative OB ROS                             Anesthesia Physical Anesthesia Plan  ASA: III  Anesthesia Plan: MAC   Post-op Pain Management:    Induction: Intravenous  PONV Risk Score and Plan: 1 and Ondansetron, Dexamethasone and Propofol infusion  Airway Management Planned: Simple Face Mask, Nasal Cannula and Natural Airway  Additional Equipment: None  Intra-op Plan:   Post-operative Plan:   Informed Consent: I have reviewed the patients History and Physical, chart, labs and discussed the procedure including the risks, benefits and alternatives for the proposed anesthesia with the patient or authorized representative who has indicated his/her understanding and acceptance.     Dental advisory given  Plan Discussed with: CRNA  Anesthesia Plan Comments: (Lab Results      Component                Value               Date                      WBC                      3.9                 06/23/2020                HGB                      15.7                06/23/2020                HCT                       44.8                06/23/2020                MCV                      95                  06/23/2020                PLT                      305  06/23/2020           Lab Results      Component                Value               Date                      NA                       144                 06/23/2020                K                        4.2                 06/23/2020                CO2                      29                  06/23/2020                GLUCOSE                  96                  06/23/2020                BUN                      8                   06/23/2020                CREATININE               0.73 (L)            06/23/2020                CALCIUM                  9.2                 06/23/2020                GFRNONAA                 114                 06/23/2020                GFRAA                    131                 06/23/2020          )        Anesthesia Quick Evaluation

## 2020-07-01 NOTE — CV Procedure (Signed)
     TRANSESOPHAGEAL ECHOCARDIOGRAM   NAME:  Jordan Norton   MRN: [id number] DOB:  18-Oct-1975   ADMIT DATE: 07/01/2020  INDICATIONS: CVA  PROCEDURE:   Informed consent was obtained prior to the procedure. The risks, benefits and alternatives for the procedure were discussed and the patient comprehended these risks.  Risks include, but are not limited to, cough, sore throat, vomiting, nausea, somnolence, esophageal and stomach trauma or perforation, bleeding, low blood pressure, aspiration, pneumonia, infection, trauma to the teeth and death.    After a procedural time-out, the oropharynx was anesthetized and the patient was sedated by the anesthesia service. The transesophageal probe was inserted in the esophagus and stomach without difficulty and multiple views were obtained. Anesthesia was monitored by Elmer Sow, CRNA.    COMPLICATIONS:    There were no immediate complications.  FINDINGS:  Bubble study positive, consistent with PFO  Oswaldo Milian MD [facility]  [address], [place], [place] [address] [phone]   4:27 PM

## 2020-07-01 NOTE — Progress Notes (Signed)
  Echocardiogram Echocardiogram Transesophageal has been performed.  Geoffery Lyons Swaim 07/01/2020, 10:06 AM

## 2020-07-01 NOTE — Transfer of Care (Signed)
Immediate Anesthesia Transfer of Care Note  Patient: Jordan Norton  Procedure(s) Performed: TRANSESOPHAGEAL ECHOCARDIOGRAM (TEE) (N/A ) BUBBLE STUDY  Patient Location: Endoscopy Unit  Anesthesia Type:MAC  Level of Consciousness: drowsy  Airway & Oxygen Therapy: Patient Spontanous Breathing and Patient connected to nasal cannula oxygen  Post-op Assessment: Report given to RN and Post -op Vital signs reviewed and stable  Post vital signs: Reviewed and stable  Last Vitals:  Vitals Value Taken Time  BP 104/65 07/01/20 1004  Temp    Pulse 57 07/01/20 1006  Resp 19 07/01/20 1006  SpO2 99 % 07/01/20 1006  Vitals shown include unvalidated device data.  Last Pain:  Vitals:   07/01/20 0817  TempSrc: Oral  PainSc: 0-No pain         Complications: No complications documented.

## 2020-07-01 NOTE — Anesthesia Postprocedure Evaluation (Signed)
Anesthesia Post Note  Patient: Jordan Norton  Procedure(s) Performed: TRANSESOPHAGEAL ECHOCARDIOGRAM (TEE) (N/A ) BUBBLE STUDY     Patient location during evaluation: PACU Anesthesia Type: MAC Level of consciousness: awake and alert Pain management: pain level controlled Vital Signs Assessment: post-procedure vital signs reviewed and stable Respiratory status: spontaneous breathing, nonlabored ventilation, respiratory function stable and patient connected to nasal cannula oxygen Cardiovascular status: stable and blood pressure returned to baseline Postop Assessment: no apparent nausea or vomiting Anesthetic complications: no   No complications documented.  Last Vitals:  Vitals:   07/01/20 1015 07/01/20 1020  BP: 117/78 117/63  Pulse: (!) 59 (!) 52  Resp: 17 16  Temp:    SpO2: 100% 100%    Last Pain:  Vitals:   07/01/20 1020  TempSrc:   PainSc: 0-No pain                 Belenda Cruise P Janisse Ghan

## 2020-07-02 ENCOUNTER — Encounter (HOSPITAL_COMMUNITY): Payer: Self-pay | Admitting: Cardiology

## 2020-07-04 ENCOUNTER — Telehealth: Payer: Self-pay | Admitting: Cardiovascular Disease

## 2020-07-04 NOTE — Telephone Encounter (Signed)
Patient has procedure scheduled for November 10th - would like to know how long he needs to take off of work for recovery time   Please call/advise. Thank you!

## 2020-07-04 NOTE — Telephone Encounter (Signed)
Called patient to let him know that Dr. Burt Knack will be calling him to discuss the procedure and his nurse will call him with all the instructions early next week. Informed patient that he would most likely need to stay out for the rest of that week, and possibly go back to work the following week. Informed patient that Dr. Antionette Char nurse would give him with more details when she calls him. Patient verbalized understanding.

## 2020-07-07 ENCOUNTER — Ambulatory Visit: Payer: BC Managed Care – PPO | Admitting: Neurology

## 2020-07-07 NOTE — Telephone Encounter (Signed)
See TEE result note. Dr. Burt Knack personally spoke with the patient and he agrees to proceed.  Per Dr. Burt Knack, will START PLAVIX 75 mg daily 5 days prior to procedure.

## 2020-07-08 MED ORDER — CLOPIDOGREL BISULFATE 75 MG PO TABS: 75.0000 mg | ORAL_TABLET | Freq: Every day | ORAL | 1 refills | Status: AC

## 2020-07-08 NOTE — Telephone Encounter (Signed)
Reviewed PFO closure instructions with patient in detail (see instruction letter in letters tab). He will START PLAVIX no later than 11/5 - called in to requested pharmacy. He understands he will need SBE for dental visits x6 months. He has no current appointments but will call if one is scheduled.  Covid test 11/8. Procedure 11/10. Follow-up appointment 12/15. The patient was grateful for assistance and agrees with plan.

## 2020-07-14 ENCOUNTER — Other Ambulatory Visit (HOSPITAL_COMMUNITY)
Admission: RE | Admit: 2020-07-14 | Discharge: 2020-07-14 | Disposition: A | Discharge status: Home / Self Care | Payer: BC Managed Care – PPO | Source: Ambulatory Visit | Attending: Cardiovascular Disease | Admitting: Cardiovascular Disease

## 2020-07-14 ENCOUNTER — Telehealth: Payer: Self-pay | Admitting: Cardiovascular Disease

## 2020-07-14 DIAGNOSIS — Z79899 Other long term (current) drug therapy: Secondary | ICD-10-CM | POA: Diagnosis not present

## 2020-07-14 DIAGNOSIS — Z880 Allergy status to penicillin: Secondary | ICD-10-CM | POA: Diagnosis not present

## 2020-07-14 DIAGNOSIS — Q211 Atrial septal defect: Secondary | ICD-10-CM | POA: Diagnosis present

## 2020-07-14 DIAGNOSIS — Z20822 Contact with and (suspected) exposure to covid-19: Secondary | ICD-10-CM | POA: Diagnosis not present

## 2020-07-14 DIAGNOSIS — Z885 Allergy status to narcotic agent status: Secondary | ICD-10-CM | POA: Diagnosis not present

## 2020-07-14 DIAGNOSIS — Z7982 Long term (current) use of aspirin: Secondary | ICD-10-CM | POA: Diagnosis not present

## 2020-07-14 DIAGNOSIS — Z01812 Encounter for preprocedural laboratory examination: Secondary | ICD-10-CM | POA: Insufficient documentation

## 2020-07-14 DIAGNOSIS — Z881 Allergy status to other antibiotic agents status: Secondary | ICD-10-CM | POA: Diagnosis not present

## 2020-07-14 LAB — SARS CORONAVIRUS 2 (TAT 6-24 HRS): SARS Coronavirus 2: NEGATIVE

## 2020-07-14 NOTE — Telephone Encounter (Signed)
The patient states he saw some family members this weekend and his brother in law ended up testing positive for Covid since that visit. He states the only time he sustained contact with his brother in law was outside, greater than 3-6 feet and no longer than 15 minutes.  Confirmed with the patient he is Covid-19 vaccinated.  The patient had a Covid test today. Per Dr. Burt Knack, will have repeat Covid test tomorrow. The patient was grateful for call and agrees with plan.

## 2020-07-14 NOTE — Telephone Encounter (Signed)
Patient called in to let us know that he was exposed to [place] on Saturday evening. He has a procedure scheduled with Dr. Burt Knack on 07/16/2020 that needs to be rescheduled as well as the lab screening that is supposed to take place today at 1:45 PM. Please call/advise.   Thank you!

## 2020-07-14 NOTE — Telephone Encounter (Signed)
Confirmed with patient his PFO closure arrival time has been pushed back to 1030 on 11/10. He understands he will be notified if any of his Covid tests are positive. He was grateful for call and agrees with plan.

## 2020-07-15 ENCOUNTER — Other Ambulatory Visit (HOSPITAL_COMMUNITY): Payer: BC Managed Care – PPO

## 2020-07-15 ENCOUNTER — Telehealth: Payer: Self-pay | Admitting: *Deleted

## 2020-07-15 ENCOUNTER — Other Ambulatory Visit (HOSPITAL_COMMUNITY)
Admission: RE | Admit: 2020-07-15 | Discharge: 2020-07-15 | Disposition: A | Discharge status: Home / Self Care | Payer: BC Managed Care – PPO | Source: Ambulatory Visit | Attending: Cardiovascular Disease | Admitting: Cardiovascular Disease

## 2020-07-15 DIAGNOSIS — Z01818 Encounter for other preprocedural examination: Secondary | ICD-10-CM | POA: Insufficient documentation

## 2020-07-15 DIAGNOSIS — Z20822 Contact with and (suspected) exposure to covid-19: Secondary | ICD-10-CM | POA: Insufficient documentation

## 2020-07-15 DIAGNOSIS — Q211 Atrial septal defect: Secondary | ICD-10-CM | POA: Diagnosis not present

## 2020-07-15 LAB — SARS CORONAVIRUS 2 (TAT 6-24 HRS): SARS Coronavirus 2: NEGATIVE

## 2020-07-15 NOTE — Telephone Encounter (Signed)
Pt contacted pre-PFO closure  scheduled at [facility] for: Wednesday July 16, 2020 12:30 PM Verified arrival time and place: [facility]) at: 10:30 AM   No solid food after midnight prior to cath, clear liquids until 5 AM day of procedure.  AM meds can be  taken pre-cath with sips of water including: ASA 81 mg Plavix 75 mg  Confirmed patient has responsible adult to drive home post procedure and be with patient first 24 hours after arriving home: yes  You are allowed ONE visitor in the waiting room during the time you are at the hospital for your procedure. Both you and your visitor must wear a mask once you enter the hospital.       COVID-19 Pre-Screening Questions:  . In the past 14 days have you had a new cough, new headache, new nasal congestion, fever (100.4 or greater) unexplained body aches, new sore throat, or sudden loss of taste or sense of smell? no . In the past 14 days have you been around anyone with known Covid 19? -see phone note 07/14/20  Reviewed procedure/mask/visitor instructions, COVID-19 questions with patient.

## 2020-07-16 ENCOUNTER — Ambulatory Visit (HOSPITAL_BASED_OUTPATIENT_CLINIC_OR_DEPARTMENT_OTHER): Payer: BC Managed Care – PPO

## 2020-07-16 ENCOUNTER — Other Ambulatory Visit: Payer: Self-pay

## 2020-07-16 ENCOUNTER — Ambulatory Visit (HOSPITAL_COMMUNITY)
Admission: RE | Admit: 2020-07-16 | Discharge: 2020-07-16 | Disposition: A | Discharge status: Home / Self Care | Payer: BC Managed Care – PPO | Attending: Cardiovascular Disease | Admitting: Cardiovascular Disease

## 2020-07-16 ENCOUNTER — Ambulatory Visit (HOSPITAL_COMMUNITY)
Admission: RE | Disposition: A | Discharge status: Home / Self Care | Payer: Self-pay | Source: Home / Self Care | Attending: Cardiovascular Disease

## 2020-07-16 DIAGNOSIS — Z79899 Other long term (current) drug therapy: Secondary | ICD-10-CM | POA: Insufficient documentation

## 2020-07-16 DIAGNOSIS — Q211 Atrial septal defect: Secondary | ICD-10-CM | POA: Diagnosis not present

## 2020-07-16 DIAGNOSIS — Z880 Allergy status to penicillin: Secondary | ICD-10-CM | POA: Insufficient documentation

## 2020-07-16 DIAGNOSIS — Z881 Allergy status to other antibiotic agents status: Secondary | ICD-10-CM | POA: Insufficient documentation

## 2020-07-16 DIAGNOSIS — Q2112 Patent foramen ovale: Secondary | ICD-10-CM

## 2020-07-16 DIAGNOSIS — Z7982 Long term (current) use of aspirin: Secondary | ICD-10-CM | POA: Insufficient documentation

## 2020-07-16 DIAGNOSIS — Z20822 Contact with and (suspected) exposure to covid-19: Secondary | ICD-10-CM | POA: Insufficient documentation

## 2020-07-16 DIAGNOSIS — Z885 Allergy status to narcotic agent status: Secondary | ICD-10-CM | POA: Insufficient documentation

## 2020-07-16 HISTORY — PX: PATENT FORAMEN OVALE(PFO) CLOSURE: CATH118300

## 2020-07-16 LAB — ECHOCARDIOGRAM LIMITED
Area-P 1/2: 2.16 cm2
Height: 70 in
Weight: 2784 oz

## 2020-07-16 LAB — POCT ACTIVATED CLOTTING TIME
Activated Clotting Time: 164 seconds
Activated Clotting Time: 158 seconds
Activated Clotting Time: 158 seconds
Activated Clotting Time: 202 seconds

## 2020-07-16 SURGERY — PATENT FORAMEN OVALE (PFO) CLOSURE
Anesthesia: LOCAL

## 2020-07-16 MED ORDER — HEPARIN (PORCINE) IN NACL 1000-0.9 UT/500ML-% IV SOLN
INTRAVENOUS | Status: DC | PRN
  Administered 2020-07-16 (×2): 500 mL

## 2020-07-16 MED ORDER — SODIUM CHLORIDE 0.9 % IV SOLN: Freq: Once | INTRAVENOUS | Status: AC

## 2020-07-16 MED ORDER — ONDANSETRON HCL 4 MG/2ML IJ SOLN: 4.0000 mg | Freq: Four times a day (QID) | INTRAMUSCULAR | Status: DC | PRN

## 2020-07-16 MED ORDER — SODIUM CHLORIDE 0.9 % IV SOLN: 250.0000 mL | INTRAVENOUS | Status: DC | PRN

## 2020-07-16 MED ORDER — SODIUM CHLORIDE 0.9 % IV BOLUS: 1000.0000 mL | Freq: Once | INTRAVENOUS | Status: DC

## 2020-07-16 MED ORDER — SODIUM CHLORIDE 0.9 % WEIGHT BASED INFUSION: 1.0000 mL/kg/h | INTRAVENOUS | Status: DC

## 2020-07-16 MED ORDER — LIDOCAINE HCL (PF) 1 % IJ SOLN
INTRAMUSCULAR | Status: DC | PRN
  Administered 2020-07-16: 10 mL

## 2020-07-16 MED ORDER — HEPARIN SODIUM (PORCINE) 1000 UNIT/ML IJ SOLN
INTRAMUSCULAR | Status: DC | PRN
  Administered 2020-07-16: 5000 [IU] via INTRAVENOUS
  Administered 2020-07-16: 3000 [IU] via INTRAVENOUS
  Administered 2020-07-16: 6000 [IU] via INTRAVENOUS

## 2020-07-16 MED ORDER — CLOPIDOGREL BISULFATE 75 MG PO TABS: 75.0000 mg | ORAL_TABLET | ORAL | Status: DC

## 2020-07-16 MED ORDER — LABETALOL HCL 5 MG/ML IV SOLN: 10.0000 mg | INTRAVENOUS | Status: DC | PRN

## 2020-07-16 MED ORDER — LIDOCAINE HCL (PF) 1 % IJ SOLN
INTRAMUSCULAR | Status: AC
  Filled 2020-07-16: qty 30

## 2020-07-16 MED ORDER — SODIUM CHLORIDE 0.9 % WEIGHT BASED INFUSION
3.0000 mL/kg/h | INTRAVENOUS | Status: AC
  Administered 2020-07-16: 3 mL/kg/h via INTRAVENOUS

## 2020-07-16 MED ORDER — FENTANYL CITRATE (PF) 100 MCG/2ML IJ SOLN
INTRAMUSCULAR | Status: DC | PRN
  Administered 2020-07-16 (×2): 25 ug via INTRAVENOUS

## 2020-07-16 MED ORDER — VANCOMYCIN HCL IN DEXTROSE 1-5 GM/200ML-% IV SOLN
INTRAVENOUS | Status: AC
  Filled 2020-07-16: qty 200

## 2020-07-16 MED ORDER — ASPIRIN 81 MG PO CHEW: 81.0000 mg | CHEWABLE_TABLET | ORAL | Status: DC

## 2020-07-16 MED ORDER — HYDRALAZINE HCL 20 MG/ML IJ SOLN: 10.0000 mg | INTRAMUSCULAR | Status: DC | PRN

## 2020-07-16 MED ORDER — SODIUM CHLORIDE 0.9% FLUSH: 3.0000 mL | Freq: Two times a day (BID) | INTRAVENOUS | Status: DC

## 2020-07-16 MED ORDER — SODIUM CHLORIDE 0.9% FLUSH: 3.0000 mL | INTRAVENOUS | Status: DC | PRN

## 2020-07-16 MED ORDER — VANCOMYCIN HCL IN DEXTROSE 1-5 GM/200ML-% IV SOLN
1000.0000 mg | INTRAVENOUS | Status: AC
  Administered 2020-07-16: 1000 mg via INTRAVENOUS

## 2020-07-16 MED ORDER — HEPARIN SODIUM (PORCINE) 1000 UNIT/ML IJ SOLN
INTRAMUSCULAR | Status: AC
  Filled 2020-07-16: qty 1

## 2020-07-16 MED ORDER — FENTANYL CITRATE (PF) 100 MCG/2ML IJ SOLN
INTRAMUSCULAR | Status: AC
  Filled 2020-07-16: qty 2

## 2020-07-16 MED ORDER — MIDAZOLAM HCL 5 MG/5ML IJ SOLN
INTRAMUSCULAR | Status: AC
  Filled 2020-07-16: qty 5

## 2020-07-16 MED ORDER — SODIUM CHLORIDE 0.9 % IV SOLN: INTRAVENOUS | Status: DC

## 2020-07-16 MED ORDER — HEPARIN (PORCINE) IN NACL 1000-0.9 UT/500ML-% IV SOLN
INTRAVENOUS | Status: AC
  Filled 2020-07-16: qty 1000

## 2020-07-16 MED ORDER — MIDAZOLAM HCL 2 MG/2ML IJ SOLN
INTRAMUSCULAR | Status: DC | PRN
  Administered 2020-07-16: 2 mg via INTRAVENOUS
  Administered 2020-07-16: 1 mg via INTRAVENOUS

## 2020-07-16 SURGICAL SUPPLY — 15 items
CATH ACUNAV 8FR 90CM (CATHETERS) ×2 IMPLANT
CATH INFINITI 6F MPA2 100CM (CATHETERS) ×2 IMPLANT
CLOSURE PERCLOSE PROSTYLE (VASCULAR PRODUCTS) ×4 IMPLANT
COVER SWIFTLINK CONNECTOR (BAG) ×2 IMPLANT
GUIDEWIRE AMPLATZER 1.5JX260 (WIRE) ×2 IMPLANT
OCCLUDER AMPLATZER PFO 25MM (Prosthesis & Implant Heart) ×2 IMPLANT
PACK CARDIAC CATHETERIZATION (CUSTOM PROCEDURE TRAY) ×2 IMPLANT
PROTECTION STATION PRESSURIZED (MISCELLANEOUS) ×2
SHEATH INTROD W/O MIN 9FR 25CM (SHEATH) ×2 IMPLANT
SHEATH PINNACLE 8F 10CM (SHEATH) ×2 IMPLANT
SHEATH PROBE COVER 6X72 (BAG) ×2 IMPLANT
STATION PROTECTION PRESSURIZED (MISCELLANEOUS) ×1 IMPLANT
SYS DELIVER AMP TREVISIO 8FR (SHEATH) ×2
SYSTEM DELIVER AMP TREVIS 8FR (SHEATH) ×1 IMPLANT
WIRE EMERALD 3MM-J .035X150CM (WIRE) ×2 IMPLANT

## 2020-07-16 NOTE — Progress Notes (Signed)
Dr Burt Knack in to see pt states ok for pt to discharge home

## 2020-07-16 NOTE — Progress Notes (Signed)
Ambulated in room and hallway tol well. No bleeding noted before or after ambulation.

## 2020-07-16 NOTE — Interval H&P Note (Signed)
History and Physical Interval Note:  07/16/2020 11:40 AM  Jordan Norton  has presented today for surgery, with the diagnosis of PFO.  The various methods of treatment have been discussed with the patient and family. After consideration of risks, benefits and other options for treatment, the patient has consented to  Procedure(s): PATENT FORAMEN OVALE (PFO) CLOSURE (N/A) as a surgical intervention.  The patient's history has been reviewed, patient examined, no change in status, stable for surgery.  I have reviewed the patient's chart and labs.  Questions were answered to the patient's satisfaction.    TEE shows moderate-large PFO. This has been reviewed with the patient. We discussed the specifics of PFO closure, including rationale for closure for reduction in risk of secondary stroke. I have reviewed risks, indications, and alternatives with him. All questions answered.   Sherren Mocha

## 2020-07-16 NOTE — Progress Notes (Signed)
Pa in to assess pt groin site assessed and new orders noted for bolus

## 2020-07-16 NOTE — Progress Notes (Signed)
During bedside report bp noted to be 87/50, 82/47,78/52.  Pt states he feels hot and has  tenderness to right groin site.  Slight swelling noted at groin site. Pa paged.

## 2020-07-16 NOTE — Progress Notes (Signed)
Echo complete

## 2020-07-16 NOTE — Progress Notes (Signed)
° ° °  Called by RN reporting BP drop approximately 1500 with SBPs in the 70-80bpm range. Pt mentating well at this time. Will give a total of 1L NS fluid bolus. HR remains in the 50s which appears to be at his baseline. Echo team currently at bedside. Groin sign looks stable with no sign of hematoma.   Kathyrn Drown NP-C [place] Pager: [phone]

## 2020-07-16 NOTE — Progress Notes (Signed)
  Echocardiogram 2D Echocardiogram limited has been performed.  Darlina Sicilian M 07/16/2020, 3:29 PM

## 2020-07-16 NOTE — Discharge Instructions (Signed)
You will require antibiotics prior to any dental work, including cleanings, for 6 months after your PFO/ASD closure. This is to protect the device from potentially getting infected from bacteria in your mouth entering your bloodstream. The medication will be called into your pharmacy on file. Please pick this up to have ready before any scheduled dental work. Instructions will be outlined on the bottle. The medication should be taken 1 hour prior to your dental appointment.   Groin Site Care Refer to this sheet in the next few weeks. These instructions provide you with information on caring for yourself after your procedure. Your caregiver may also give you more specific instructions. Your treatment has been planned according to current medical practices, but problems sometimes occur. Call your caregiver if you have any problems or questions after your procedure. HOME CARE INSTRUCTIONS  You may shower 24 hours after the procedure. Remove the bandage (dressing) and gently wash the site with plain soap and water. Gently pat the site dry.   Do not apply powder or lotion to the site.   Do not sit in a bathtub, swimming pool, or whirlpool for 5 to 7 days.   No bending, squatting, or lifting anything over 10 pounds (4.5 kg) for 1 week  Inspect the site at least twice daily.   Do not drive home if you are discharged the same day of the procedure. Have someone else drive you.   You may drive 24 hours after the procedure unless otherwise instructed by your caregiver.  What to expect:  Any bruising will usually fade within 1 to 2 weeks.   Blood that collects in the tissue (hematoma) may be painful to the touch. It should usually decrease in size and tenderness within 1 to 2 weeks.  SEEK IMMEDIATE MEDICAL CARE IF:  You have unusual pain at the groin site or down the affected leg.   You have redness, warmth, swelling, or pain at the groin site.   You have drainage (other than a small amount of  blood on the dressing).   You have chills.   You have a fever or persistent symptoms for more than 72 hours.   You have a fever and your symptoms suddenly get worse.   Your leg becomes pale, cool, tingly, or numb.   You have heavy bleeding from the site. Hold pressure on the site.

## 2020-07-17 ENCOUNTER — Encounter (HOSPITAL_COMMUNITY): Payer: Self-pay | Admitting: Cardiovascular Disease

## 2020-07-17 MED FILL — Midazolam HCl Inj 5 MG/5ML (Base Equivalent): INTRAMUSCULAR | Qty: 3 | Status: AC

## 2020-07-21 ENCOUNTER — Other Ambulatory Visit: Payer: Self-pay

## 2020-07-21 ENCOUNTER — Telehealth: Payer: Self-pay | Admitting: Cardiovascular Disease

## 2020-07-21 ENCOUNTER — Other Ambulatory Visit: Payer: BC Managed Care – PPO

## 2020-07-21 ENCOUNTER — Ambulatory Visit (INDEPENDENT_AMBULATORY_CARE_PROVIDER_SITE_OTHER)
Admission: RE | Admit: 2020-07-21 | Discharge: 2020-07-21 | Disposition: A | Discharge status: Home / Self Care | Payer: BC Managed Care – PPO | Source: Ambulatory Visit | Attending: Cardiovascular Disease | Admitting: Cardiovascular Disease

## 2020-07-21 DIAGNOSIS — Q211 Atrial septal defect: Secondary | ICD-10-CM

## 2020-07-21 DIAGNOSIS — Q2112 Patent foramen ovale: Secondary | ICD-10-CM

## 2020-07-21 DIAGNOSIS — R1031 Right lower quadrant pain: Secondary | ICD-10-CM

## 2020-07-21 LAB — CBC
Hematocrit: 29.8 % — ABNORMAL LOW (ref 37.5–51.0)
Hemoglobin: 10.5 g/dL — ABNORMAL LOW (ref 13.0–17.7)
MCH: 34 pg — ABNORMAL HIGH (ref 26.6–33.0)
MCHC: 35.2 g/dL (ref 31.5–35.7)
MCV: 96 fL (ref 79–97)
Platelets: 302 10*3/uL (ref 150–450)
RBC: 3.09 x10E6/uL — ABNORMAL LOW (ref 4.14–5.80)
RDW: 12.3 % (ref 11.6–15.4)
WBC: 6.7 10*3/uL (ref 3.4–10.8)

## 2020-07-21 NOTE — Telephone Encounter (Signed)
Patient calling with follow up questions in regards to his surgery done on 07/16/2020 by Dr. Burt Knack:   Pain in stomach about 3 inches above the incision - doesn't bother him when he's sitting but hurts when standing straight up or laying flat. Is this normal?   Pt c/o medication issue:  1. Name of Medication:  losartan (COZAAR) 50 MG tablet  2. How are you currently taking this medication (dosage and times per day)? Not taking currently - stopped taking on 07/20/2020  3. Are you having a reaction (difficulty breathing--STAT)?  No   4. What is your medication issue?  Blood pressure at 121/65 at about 820AM this morning - today was the first day that the top number has been over 120 since having surgery done on 07/16/2020. Patient wants to know if he should be taking his blood pressure medicine.

## 2020-07-21 NOTE — Telephone Encounter (Addendum)
Spoke with the pt and he agrees to having a CT and CBC to r/o hematoma.  Precert done per [employer].   Pt is also reporting that he is just noticing that he is having right testicle bruising, edema and pain.   Will note for Dr. Burt Knack review.

## 2020-07-21 NOTE — Telephone Encounter (Signed)
Pt called to report that since he had a PFO closure 07/16/20 and he has not been able to stand up straight since then.. he says only when he stands up he has a sharp pain in his belly area 4 inches above his incision site and is relieved by laying down or sitting.   He denies fever, edema, drainage from the site.. bruising only at the site itself.  With palpation it only hurts if he presses on it very hard and that pain is not as bad as when he is just standing.  Pt says he has had a normal bowel movement since the procedure.  He has had the pain a few times when urinating over the weekend but no burning, hematuria, or urgency.   Pt says that he has held his Cozaar yesterday and today due to "normal" BP.... Sat 104/60, Sun 110/65, today 121/65... pt has not had any dizziness... will recheck his BP this afternoon and keep a record.   Will forward to Dr. Burt Knack for review. The patient has follow up appt with Kathlene November PA 08/20/20.

## 2020-07-21 NOTE — Telephone Encounter (Signed)
Dr. Burt Knack aware and has seen the pt re: his CT results.

## 2020-07-21 NOTE — Telephone Encounter (Signed)
The patient will come for CT and lab work this afternoon. He is scheduled for 1545.

## 2020-07-21 NOTE — Telephone Encounter (Signed)
Would do a noncontrasted CT of the abdomen and pelvis to evaluate for hematoma. Also do CBC please. Rectus sheath hematoma can present with these symptoms.

## 2020-07-23 ENCOUNTER — Other Ambulatory Visit: Payer: Self-pay

## 2020-07-23 ENCOUNTER — Encounter: Payer: Self-pay | Admitting: Cardiovascular Disease

## 2020-07-23 ENCOUNTER — Ambulatory Visit: Payer: BC Managed Care – PPO | Admitting: Cardiovascular Disease

## 2020-07-23 VITALS — BP 128/80 | HR 95 | Ht 70.0 in | Wt 171.4 lb

## 2020-07-23 DIAGNOSIS — K661 Hemoperitoneum: Secondary | ICD-10-CM | POA: Diagnosis not present

## 2020-07-23 DIAGNOSIS — Q2112 Patent foramen ovale: Secondary | ICD-10-CM

## 2020-07-23 DIAGNOSIS — Q211 Atrial septal defect: Secondary | ICD-10-CM | POA: Diagnosis not present

## 2020-07-23 NOTE — Patient Instructions (Signed)
Medication Instructions:  Continue to hold your Losartan until your blood pressure is consistently over 140/90. *If you need a refill on your cardiac medications before your next appointment, please call your pharmacy*  Lab Work: TODAY! BMET, CBC If you have labs (blood work) drawn today and your tests are completely normal, you will receive your results only by: Marland Kitchen MyChart Message (if you have MyChart) OR . A paper copy in the mail If you have any lab test that is abnormal or we need to change your treatment, we will call you to review the results.  Testing/Procedures: Dr. Burt Knack recommends you have a CT ABD/pelvis on 12/13 or 12/14.  Follow-Up: Please keep your appointment with Nell Range as scheduled.

## 2020-07-23 NOTE — Progress Notes (Signed)
Cardiology Office Note:    Date:  07/23/2020   ID:  Jordan Norton, DOB 1975-10-15, MRN [id number]  PCP:  Aletha Halim., PA-C  [place] Cardiologist:  No primary care provider on file.  [facility] Electrophysiologist:  None   Referring MD: Aletha Halim., PA-C   Chief Complaint  Patient presents with  . Back Pain    History of Present Illness:    Jordan Norton is a 44 y.o. male with a hx of PFO and cryptogenic stroke who recently underwent transcatheter PFO closure 07/16/2020.  The patient was treated with a 25 mm Amplatzer PFO occluder.  While his groin site was stable, he did have some orthostatic hypotension post procedure.  He was discharged home that same day after his blood pressure stabilized with volume resuscitation.  However, the patient called in with abdominal pain, flank pain, and bruising.  We performed a noncontrasted CT of the abdomen and pelvis earlier this week and this demonstrated findings consistent with retroperitoneal hemorrhage.  His hemoglobin had dropped from a baseline of about 15 down to 10.5 g/dL.  He was otherwise stable with no hypotension or lightheadedness.  He returns today for follow-up check.  He has had some nausea but reports no change in his bowels or bladder.  He is able to eat food without difficulty.  He has some discomfort still in the lower quadrant on the right side and in his back.  He otherwise has no specific complaints.  Denies chest pain or shortness of breath.  He has had some easy fatigue.  Past Medical History:  Diagnosis Date  . Asthma   . Hypertension   . Seizures ([place])     Past Surgical History:  Procedure Laterality Date  . BUBBLE STUDY  07/01/2020   Procedure: BUBBLE STUDY;  Surgeon: Donato Heinz, MD;  Location: [place];  Service: Cardiovascular;;  . DENTAL SURGERY    . PATENT FORAMEN OVALE(PFO) CLOSURE N/A 07/16/2020   Procedure: PATENT FORAMEN OVALE (PFO) CLOSURE;  Surgeon: Sherren Mocha, MD;   Location: [place] [facility];  Service: Cardiovascular;  Laterality: N/A;  . TEE WITHOUT CARDIOVERSION N/A 07/01/2020   Procedure: TRANSESOPHAGEAL ECHOCARDIOGRAM (TEE);  Surgeon: Donato Heinz, MD;  Location: [facility] ENDOSCOPY;  Service: Cardiovascular;  Laterality: N/A;    Current Medications: Current Meds  Medication Sig  . albuterol (PROAIR HFA) 108 (90 BASE) MCG/ACT inhaler Inhale 2 puffs into the lungs every 6 (six) hours as needed for wheezing or shortness of breath.   Marland Kitchen aspirin EC 81 MG tablet Take 1 tablet (81 mg total) by mouth daily. Swallow whole.  Marland Kitchen atorvastatin (LIPITOR) 40 MG tablet Take 1 tablet (40 mg total) by mouth daily.  . clopidogrel (PLAVIX) 75 MG tablet Take 1 tablet (75 mg total) by mouth daily.  . fluticasone (FLONASE) 50 MCG/ACT nasal spray Place 1 spray into the nose daily.   . fluticasone (FLOVENT HFA) 44 MCG/ACT inhaler Inhale 2 puffs into the lungs daily.   Marland Kitchen levETIRAcetam (KEPPRA XR) 500 MG 24 hr tablet Take 1 tablet (500 mg total) by mouth daily.  Marland Kitchen loratadine (CLARITIN) 10 MG tablet Take 10 mg by mouth daily.   . Multiple Vitamin (MULTIVITAMIN WITH MINERALS) TABS tablet Take 1 tablet by mouth daily.  . pantoprazole (PROTONIX) 40 MG tablet Take 40 mg by mouth daily.  . phenytoin (DILANTIN) 300 MG ER capsule Take 1 tablet by mouth daily     Allergies:   Eggs or egg-derived products, Acetaminophen,  Hydrocodone-acetaminophen, Levofloxacin, and Penicillins   Social History   Socioeconomic History  . Marital status: Single    Spouse name: Not on file  . Number of children: Not on file  . Years of education: 31  . Highest education level: Not on file  Occupational History  . 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 Smoking status: Never Smoker  . Smokeless tobacco: Never Used  Substance and Sexual Activity  . Alcohol use: No  . Drug use: Yes    Types: Marijuana  . Sexual activity: Never  Other Topics Concern  . Not on file   Social History Narrative   Lives alone in a home    Drinks Coffee (1 pot) a day    Social Determinants of Health   Financial Resource Strain:   . Difficulty of Paying Living Expenses: Not on file  Food Insecurity:   . Worried About Charity fundraiser in the Last Year: Not on file  . Ran Out of Food in the Last Year: Not on file  Transportation Needs:   . Lack of Transportation (Medical): Not on file  . Lack of Transportation (Non-Medical): Not on file  Physical Activity:   . Days of Exercise per Week: Not on file  . Minutes of Exercise per Session: Not on file  Stress:   . Feeling of Stress : Not on file  Social Connections:   . Frequency of Communication with Friends and Family: Not on file  . Frequency of Social Gatherings with Friends and Family: Not on file  . Attends Religious Services: Not on file  . Active Member of Clubs or Organizations: Not on file  . Attends Archivist Meetings: Not on file  . Marital Status: Not on file     Family History: The patient's family history includes Cancer in his maternal uncle and mother; Diabetes in his paternal grandmother; Heart disease in his maternal grandfather; Hypertension in his father; Parkinsonism in his maternal grandmother and mother.  ROS:   Please see the history of present illness.    All other systems reviewed and are negative.  EKGs/Labs/Other Studies Reviewed:    The following studies were reviewed today: CT Abd/Pelvis: IMPRESSION: 1. Diffuse lower abdominal and pelvic retroperitoneal soft tissue thickening, suboptimally evaluated. Given the clinical history, small volume hemorrhage is a concern. 2. Soft tissue fullness within the right hemipelvis, causing apparent mass-effect upon the sigmoid colon. Although this could simply represent non-opacified bowel loops on this noncontrast exam, cannot exclude mass or hematoma in this region. 3. Exophytic indeterminate mass off of or adjacent to the left  lobe of the liver. Subacute hematoma (among many etiologies) could have this appearance. 4. In order to further characterize the above findings, consider IV contrast enhanced CT.  Recent Labs: 10/01/2019: ALT 25 06/23/2020: BUN 8; Creatinine, Ser 0.73; Potassium 4.2; Sodium 144 07/21/2020: Hemoglobin 10.5; Platelets 302  Recent Lipid Panel    Component Value Date/Time   CHOL 198 05/28/2020 0902   TRIG 78 05/28/2020 0902   HDL 75 05/28/2020 0902   CHOLHDL 2.6 05/28/2020 0902   LDLCALC 109 (H) 05/28/2020 0902     Risk Assessment/Calculations:       Physical Exam:    VS:  BP 128/80   Pulse 95   Ht 5\' 10"  (1.778 m)   Wt 171 lb 6.4 oz (77.7 kg)   SpO2 96%   BMI 24.59 kg/m     Wt Readings from Last 3 Encounters:  07/23/20 171 lb 6.4 oz (77.7 kg)  07/16/20 174 lb (78.9 kg)  07/01/20 175 lb (79.4 kg)     GEN:  Well nourished, well developed in no acute distress HEENT: Normal NECK: No JVD; No carotid bruits LYMPHATICS: No lymphadenopathy CARDIAC: RRR, no murmurs, rubs, gallops RESPIRATORY:  Clear to auscultation without rales, wheezing or rhonchi  ABDOMEN: Soft, no masses, mild right lower quadrant tenderness, no flank tenderness MUSCULOSKELETAL:  No edema; No deformity.  There is diffuse ecchymoses in the right groin area without firm hematoma, some improvement over the last 48 hours noted. SKIN: Warm and dry NEUROLOGIC:  Alert and oriented x 3 PSYCHIATRIC:  Normal affect   ASSESSMENT:    1. PFO (patent foramen ovale)   2. Retroperitoneal hematoma    PLAN:    In order of problems listed above:  1. Status post transcatheter closure.  Treated with aspirin and clopidogrel.  Should continue for 6 months.  Should follow SBE prophylaxis x6 months.  Repeat echo with bubble study in 1 year. 2. PFO closure procedure complicated by hematoma.  Noncontrasted chest CT reviewed with the patient today.  Because of the hemorrhage noted, there are some findings that need further  evaluation.  The patient will undergo a repeat CT of the abdomen and pelvis with and without contrast in about 4 weeks.  This should allow for resolution of his hematoma so that other findings can be better evaluated.  The patient seems to be improving.  He has no orthostatic signs or symptoms.  I have asked him to stay off of his losartan until his blood pressure is greater than 140/90 and then he should resume.  He will return for follow-up as scheduled next month.  We are going to repeat a CBC and metabolic panel today.    Shared Decision Making/Informed Consent        Medication Adjustments/Labs and Tests Ordered: Current medicines are reviewed at length with the patient today.  Concerns regarding medicines are outlined above.  No orders of the defined types were placed in this encounter.  No orders of the defined types were placed in this encounter.   There are no Patient Instructions on file for this visit.   Signed, Sherren Mocha, MD  07/23/2020 1:43 PM    [facility]

## 2020-07-24 ENCOUNTER — Other Ambulatory Visit: Payer: Self-pay

## 2020-07-24 DIAGNOSIS — K661 Hemoperitoneum: Secondary | ICD-10-CM

## 2020-07-24 LAB — CBC WITH DIFFERENTIAL/PLATELET
Basophils Absolute: 0 10*3/uL (ref 0.0–0.2)
Basos: 0 %
EOS (ABSOLUTE): 0 10*3/uL (ref 0.0–0.4)
Eos: 0 %
Hematocrit: 30.8 % — ABNORMAL LOW (ref 37.5–51.0)
Hemoglobin: 11.3 g/dL — ABNORMAL LOW (ref 13.0–17.7)
Immature Grans (Abs): 0 10*3/uL (ref 0.0–0.1)
Immature Granulocytes: 1 %
Lymphocytes Absolute: 1.2 10*3/uL (ref 0.7–3.1)
Lymphs: 14 %
MCH: 34.2 pg — ABNORMAL HIGH (ref 26.6–33.0)
MCHC: 36.7 g/dL — ABNORMAL HIGH (ref 31.5–35.7)
MCV: 93 fL (ref 79–97)
Monocytes Absolute: 0.8 10*3/uL (ref 0.1–0.9)
Monocytes: 10 %
Neutrophils Absolute: 6.4 10*3/uL (ref 1.4–7.0)
Neutrophils: 75 %
Platelets: 361 10*3/uL (ref 150–450)
RBC: 3.3 x10E6/uL — ABNORMAL LOW (ref 4.14–5.80)
RDW: 11.7 % (ref 11.6–15.4)
WBC: 8.5 10*3/uL (ref 3.4–10.8)

## 2020-07-24 LAB — BASIC METABOLIC PANEL
BUN/Creatinine Ratio: 11 (ref 9–20)
BUN: 7 mg/dL (ref 6–24)
CO2: 23 mmol/L (ref 20–29)
Calcium: 8.8 mg/dL (ref 8.7–10.2)
Chloride: 98 mmol/L (ref 96–106)
Creatinine, Ser: 0.64 mg/dL — ABNORMAL LOW (ref 0.76–1.27)
GFR calc Af Amer: 139 mL/min/{1.73_m2} (ref >59)
GFR calc non Af Amer: 120 mL/min/{1.73_m2} (ref >59)
Glucose: 74 mg/dL (ref 65–99)
Potassium: 4.5 mmol/L (ref 3.5–5.2)
Sodium: 140 mmol/L (ref 134–144)

## 2020-07-30 ENCOUNTER — Ambulatory Visit: Payer: BC Managed Care – PPO | Admitting: Cardiovascular Disease

## 2020-08-18 ENCOUNTER — Other Ambulatory Visit: Payer: Self-pay

## 2020-08-18 ENCOUNTER — Ambulatory Visit (INDEPENDENT_AMBULATORY_CARE_PROVIDER_SITE_OTHER)
Admission: RE | Admit: 2020-08-18 | Discharge: 2020-08-18 | Disposition: A | Discharge status: Home / Self Care | Payer: BC Managed Care – PPO | Source: Ambulatory Visit | Attending: Cardiovascular Disease | Admitting: Cardiovascular Disease

## 2020-08-18 ENCOUNTER — Other Ambulatory Visit: Payer: BC Managed Care – PPO | Admitting: *Deleted

## 2020-08-18 DIAGNOSIS — K661 Hemoperitoneum: Secondary | ICD-10-CM

## 2020-08-18 MED ORDER — IOHEXOL 300 MG/ML  SOLN
100.0000 mL | Freq: Once | INTRAMUSCULAR | Status: AC | PRN
  Administered 2020-08-18: 14:00:00 100 mL via INTRAVENOUS

## 2020-08-18 NOTE — Progress Notes (Signed)
[facility] [place]                                       Cardiology Office Note    Date:  08/20/2020   ID:  Jordan Norton, DOB 02-16-1976, MRN [id number]  PCP:  Aletha Halim., PA-C  Cardiologist:  Dr. Burt Knack  CC: 1 month s/p PFO closure   History of Present Illness:  Jordan Norton is a 44 y.o. male with a history of PFO and cryptogenic stroke who recently underwent transcatheter PFO closure 07/16/2020 c/b retroperitoneal bleed who presents to clinic for follo up.  The patient underwent outpatient neurology evaluation 05/28/2020 when he was seen for headache and visual disturbance.  He is known to have a seizure disorder since 2016 that has been well controlled on Dilantin and Keppra. He hasn't had a single seizure since 2016 when the second medication was added. He had an episode in July 2021 with severe headache and visual disturbance. He lost a portion of his peripheral vision on the right side and this has not improved. He was referred back to see Dr Leonie Man at that time. An MRA of the neck was normal. 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 A transcranial doppler study showed partial curtain effect consistent with a moderate PFO. TEE confirmed PFO. He was seen by Dr. Burt Knack for consideration of PFO closure.    The patient was treated with a 25 mm Amplatzer PFO occluder on 07/16/20. While his groin site was stable, he did have some orthostatic hypotension post procedure.  He was discharged home that same day after his blood pressure stabilized with volume resuscitation.  However, the patient called in with abdominal pain, flank pain, and bruising.  Non-contrasted CT of the abdomen and pelvis 07/21/20 demonstrated findings consistent with retroperitoneal hemorrhage. His hemoglobin had dropped from a baseline of about 15 down to 10.5 g/dL. Losartan 25mg  daily was held. Repeat labs showed complete normalization of blood counts. Repeat CT  08/18/20 showed essentially complete resolution of previously seen retroperitoneal hematoma with mild persistent residual soft tissue in the retroperitoneum and pelvis as well as a definitively benign, large exophytic hemangioma of the left lobe of the liver measuring 8.0 x 6.5 cm. Although definitively benign, this giant hemangioma is at some risk of hemorrhage. Surgical consultation was recommended.   Today he presents to clinic for follow up. He is doing well. Had a little flank pain after moving a large pail at work. Otherwise no issues. No CP or SOB. No LE edema, orthopnea or PND. No dizziness or syncope. No blood in stool or urine. No palpitations.    Past Medical History:  Diagnosis Date  . Asthma   . Hypertension   . Seizures ([place])     Past Surgical History:  Procedure Laterality Date  . BUBBLE STUDY  07/01/2020   Procedure: BUBBLE STUDY;  Surgeon: Donato Heinz, MD;  Location: [place];  Service: Cardiovascular;;  . DENTAL SURGERY    . PATENT FORAMEN OVALE(PFO) CLOSURE N/A 07/16/2020   Procedure: PATENT FORAMEN OVALE (PFO) CLOSURE;  Surgeon: Sherren Mocha, MD;  Location: [place] [facility];  Service: Cardiovascular;  Laterality: N/A;  . TEE WITHOUT CARDIOVERSION N/A 07/01/2020   Procedure: TRANSESOPHAGEAL ECHOCARDIOGRAM (TEE);  Surgeon: Donato Heinz, MD;  Location: [facility] ENDOSCOPY;  Service: Cardiovascular;  Laterality: N/A;    Current  Medications: Outpatient Medications Prior to Visit  Medication Sig Dispense Refill  . albuterol (VENTOLIN HFA) 108 (90 Base) MCG/ACT inhaler Inhale 2 puffs into the lungs every 6 (six) hours as needed for wheezing or shortness of breath.     Marland Kitchen aspirin EC 81 MG tablet Take 1 tablet (81 mg total) by mouth daily. Swallow whole. 150 tablet 2  . atorvastatin (LIPITOR) 40 MG tablet Take 1 tablet (40 mg total) by mouth daily. 30 tablet 11  . clopidogrel (PLAVIX) 75 MG tablet Take 1 tablet (75 mg total) by mouth daily. 90 tablet 1   . fluticasone (FLONASE) 50 MCG/ACT nasal spray Place 1 spray into the nose daily.    . fluticasone (FLOVENT HFA) 44 MCG/ACT inhaler Inhale 2 puffs into the lungs daily.     Marland Kitchen levETIRAcetam (KEPPRA XR) 500 MG 24 hr tablet Take 1 tablet (500 mg total) by mouth daily. 90 tablet 3  . loratadine (CLARITIN) 10 MG tablet Take 10 mg by mouth daily.     . Multiple Vitamin (MULTIVITAMIN WITH MINERALS) TABS tablet Take 1 tablet by mouth daily.    . pantoprazole (PROTONIX) 40 MG tablet Take 40 mg by mouth daily.  3  . phenytoin (DILANTIN) 300 MG ER capsule Take 1 tablet by mouth daily 90 capsule 3   No facility-administered medications prior to visit.     Allergies:   Eggs or egg-derived products, Acetaminophen, Hydrocodone-acetaminophen, Levofloxacin, and Penicillins   Social History   Socioeconomic History  . Marital status: Single    Spouse name: Not on file  . Number of children: Not on file  . Years of education: 67  . Highest education level: Not on file  Occupational History  . 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 Smoking status: Never Smoker  . Smokeless tobacco: Never Used  Substance and Sexual Activity  . Alcohol use: No  . Drug use: Yes    Types: Marijuana  . Sexual activity: Never  Other Topics Concern  . Not on file  Social History Narrative   Lives alone in a home    Drinks Coffee (1 pot) a day    Social Determinants of Health   Financial Resource Strain: Not on file  Food Insecurity: Not on file  Transportation Needs: Not on file  Physical Activity: Not on file  Stress: Not on file  Social Connections: Not on file     Family History:  The patient's family history includes Cancer in his maternal uncle and mother; Diabetes in his paternal grandmother; Heart disease in his maternal grandfather; Hypertension in his father; Parkinsonism in his maternal grandmother and mother.     ROS:   Please see the history of present illness.    ROS All other  systems reviewed and are negative.   PHYSICAL EXAM:   VS:  BP 118/70   Pulse 70   Ht 5\' 10"  (1.778 m)   Wt 177 lb (80.3 kg)   SpO2 98%   BMI 25.40 kg/m    GEN: Well nourished, well developed, in no acute distress HEENT: normal Neck: no JVD or masses Cardiac: RRR; no murmurs, rubs, or gallops,no edema  Respiratory:  clear to auscultation bilaterally, normal work of breathing GI: soft, nontender, nondistended, + BS MS: no deformity or atrophy Skin: warm and dry, no rash Neuro:  Alert and Oriented x 3, Strength and sensation are intact Psych: euthymic mood, full affect   Wt Readings from Last 3 Encounters:  08/20/20  177 lb (80.3 kg)  07/23/20 171 lb 6.4 oz (77.7 kg)  07/16/20 174 lb (78.9 kg)      Studies/Labs Reviewed:   EKG:  EKG is NOT ordered today.   Recent Labs: 10/01/2019: ALT 25 07/23/2020: BUN 7; Creatinine, Ser 0.64; Potassium 4.5; Sodium 140 08/18/2020: Hemoglobin 14.3; Platelets 310   Lipid Panel    Component Value Date/Time   CHOL 198 05/28/2020 0902   TRIG 78 05/28/2020 0902   HDL 75 05/28/2020 0902   CHOLHDL 2.6 05/28/2020 0902   LDLCALC 109 (H) 05/28/2020 0902    Additional studies/ records that were reviewed today include:  07/16/20 PATENT FORAMEN OVALE (PFO) CLOSURE  Conclusion Successful transcatheter PFO closure using fluoroscopic and intracardiac echo guidance  Recommendations:  Dual antiplatelet therapy with aspirin and clopidogrel x6 months  SBE prophylaxis x6 months  Same-day discharge protocol if no early apparent complications occur, limited 2D echocardiogram prior to discharge  ______________  Echo 07/16/20 IMPRESSIONS  1. Left ventricular ejection fraction, by estimation, is 55 to 60%. The  left ventricle has normal function.  2. Septal Repair:Amplazter 25 mm on 07/16/2020. No residual shunt by  color flow Doppler.    ______________________  CT abdomen 07/21/20 IMPRESSION: 1. Diffuse lower abdominal and pelvic  retroperitoneal soft tissue thickening, suboptimally evaluated. Given the clinical history, small volume hemorrhage is a concern. 2. Soft tissue fullness within the right hemipelvis, causing apparent mass-effect upon the sigmoid colon. Although this could simply represent non-opacified bowel loops on this noncontrast exam, cannot exclude mass or hematoma in this region. 3. Exophytic indeterminate mass off of or adjacent to the left lobe of the liver. Subacute hematoma (among many etiologies) could have this appearance. 4. In order to further characterize the above findings, consider IV contrast enhanced CT.  These results will be called to the ordering clinician or representative by the Radiologist Assistant, and communication documented in the PACS or [employer].  ______________________  CT abdomen 08/18/20 IMPRESSION: 1. Essentially complete resolution of previously seen retroperitoneal hematoma with mild persistent residual soft tissue in the retroperitoneum and pelvis. 2. Definitively benign, large exophytic hemangioma of the left lobe of the liver measuring 8.0 x 6.5 cm. Although definitively benign, this giant hemangioma is at some risk of hemorrhage. Recommend surgical consultation. 3. Additional definitively benign small hemangioma of the liver dome.    ASSESSMENT & PLAN:   PFO s/p percutaneous PFO closure: doing well 1 month out from closure. Groin site healing well. Continue on aspirin and plavix x 6 months followed by aspirin alone. He understands the need for SBE prophylaxis x 6 months post closure. I will see him back in 1 year with echo/bubble.  Retroperitoneal bleed: resolved by CT  Acute blood loss anemia: hg completely normalized with resolution of hematoma  HTN: Losartan was held with RP bleed and Bp has been in normal range. Will keep him off this. Will continue to monitor BP at home and resume if it becomes more elevated.   Liver hemangioma: CT  abdomen revealed a definitively benign, large exophytic hemangioma of the left lobe of the liver measuring 8.0 x 6.5 cm. Although definitively benign, this giant hemangioma is at some risk of hemorrhage. Surgical consultation was recommended. We will have this arranged.    Medication Adjustments/Labs and Tests Ordered: Current medicines are reviewed at length with the patient today.  Concerns regarding medicines are outlined above.  Medication changes, Labs and Tests ordered today are listed in the Patient Instructions below. Patient Instructions  Medication Instructions:  Stop Plavix 6 months after procedure (around 01/14/20)  *If you need a refill on your cardiac medications before your next appointment, please call your pharmacy*   Lab Work: none If you have labs (blood work) drawn today and your tests are completely normal, you will receive your results only by: Marland Kitchen MyChart Message (if you have MyChart) OR . A paper copy in the mail If you have any lab test that is abnormal or we need to change your treatment, we will call you to review the results.   Testing/Procedures: none   Follow-Up: At [facility], you and your health needs are our priority.  As part of our continuing mission to provide you with exceptional heart care, we have created designated Provider Care Teams.  These Care Teams include your primary Cardiologist (physician) and Advanced Practice Providers (APPs -  Physician Assistants and Nurse Practitioners) who all work together to provide you with the care you need, when you need it.  We recommend signing up for the patient portal called "MyChart".  Sign up information is provided on this After Visit Summary.  MyChart is used to connect with patients for Virtual Visits (Telemedicine).  Patients are able to view lab/test results, encounter notes, upcoming appointments, etc.  Non-urgent messages can be sent to your provider as well.   To learn more about what you can do  with MyChart, go to [email or web address].    Your next appointment:   12 month(s)  The format for your next appointment:   In Person  Provider:   Nell Range, PA-C   Other Instructions Echo bubble study in one year, same day as appointment with Nell Range.  You have been referred to General Surgery      Signed, Angelena Form, PA-C  08/20/2020 3:09 PM    [address], [place], [place]  [address] Phone: [phone]; Fax: [phone]

## 2020-08-19 LAB — CBC WITH DIFFERENTIAL/PLATELET
Basophils Absolute: 0 10*3/uL (ref 0.0–0.2)
Basos: 1 %
EOS (ABSOLUTE): 0.1 10*3/uL (ref 0.0–0.4)
Eos: 3 %
Hematocrit: 41.7 % (ref 37.5–51.0)
Hemoglobin: 14.3 g/dL (ref 13.0–17.7)
Immature Grans (Abs): 0 10*3/uL (ref 0.0–0.1)
Immature Granulocytes: 0 %
Lymphocytes Absolute: 1.2 10*3/uL (ref 0.7–3.1)
Lymphs: 26 %
MCH: 32.4 pg (ref 26.6–33.0)
MCHC: 34.3 g/dL (ref 31.5–35.7)
MCV: 95 fL (ref 79–97)
Monocytes Absolute: 0.4 10*3/uL (ref 0.1–0.9)
Monocytes: 8 %
Neutrophils Absolute: 2.8 10*3/uL (ref 1.4–7.0)
Neutrophils: 62 %
Platelets: 310 10*3/uL (ref 150–450)
RBC: 4.41 x10E6/uL (ref 4.14–5.80)
RDW: 12 % (ref 11.6–15.4)
WBC: 4.5 10*3/uL (ref 3.4–10.8)

## 2020-08-20 ENCOUNTER — Encounter: Payer: Self-pay | Admitting: Physician Assistant

## 2020-08-20 ENCOUNTER — Other Ambulatory Visit: Payer: Self-pay

## 2020-08-20 ENCOUNTER — Ambulatory Visit: Payer: BC Managed Care – PPO | Admitting: Physician Assistant

## 2020-08-20 VITALS — BP 118/70 | HR 70 | Ht 70.0 in | Wt 177.0 lb

## 2020-08-20 DIAGNOSIS — Z8774 Personal history of (corrected) congenital malformations of heart and circulatory system: Secondary | ICD-10-CM

## 2020-08-20 DIAGNOSIS — D1803 Hemangioma of intra-abdominal structures: Secondary | ICD-10-CM | POA: Diagnosis not present

## 2020-08-20 DIAGNOSIS — I10 Essential (primary) hypertension: Secondary | ICD-10-CM | POA: Diagnosis not present

## 2020-08-20 DIAGNOSIS — K661 Hemoperitoneum: Secondary | ICD-10-CM

## 2020-08-20 DIAGNOSIS — D62 Acute posthemorrhagic anemia: Secondary | ICD-10-CM

## 2020-08-20 NOTE — Patient Instructions (Signed)
Medication Instructions:  Stop Plavix 6 months after procedure (around 01/14/20)  *If you need a refill on your cardiac medications before your next appointment, please call your pharmacy*   Lab Work: none If you have labs (blood work) drawn today and your tests are completely normal, you will receive your results only by:  [place] (if you have MyChart) OR  A paper copy in the mail If you have any lab test that is abnormal or we need to change your treatment, we will call you to review the results.   Testing/Procedures: none   Follow-Up: At [facility], you and your health needs are our priority.  As part of our continuing mission to provide you with exceptional heart care, we have created designated Provider Care Teams.  These Care Teams include your primary Cardiologist (physician) and Advanced Practice Providers (APPs -  Physician Assistants and Nurse Practitioners) who all work together to provide you with the care you need, when you need it.  We recommend signing up for the patient portal called "MyChart".  Sign up information is provided on this After Visit Summary.  MyChart is used to connect with patients for Virtual Visits (Telemedicine).  Patients are able to view lab/test results, encounter notes, upcoming appointments, etc.  Non-urgent messages can be sent to your provider as well.   To learn more about what you can do with MyChart, go to [email or web address].    Your next appointment:   12 month(s)  The format for your next appointment:   In Person  Provider:   Nell Range, PA-C   Other Instructions Echo bubble study in one year, same day as appointment with Nell Range.  You have been referred to General Surgery

## 2020-09-02 ENCOUNTER — Ambulatory Visit: Payer: BC Managed Care – PPO | Admitting: Neurology

## 2020-09-29 ENCOUNTER — Telehealth: Payer: Self-pay | Admitting: Physician Assistant

## 2020-09-29 MED ORDER — AZITHROMYCIN 500 MG PO TABS: ORAL_TABLET | ORAL | 0 refills | Status: DC

## 2020-09-29 NOTE — Telephone Encounter (Signed)
Jordan Norton is calling stating he was advised to avoid getting any infection in his mouth by Jordan Norton. Due to this he is calling to inform Jordan Norton he broke a tooth on Friday 09/26/20. He states yesterday morning, 09/28/20 his jaw started swelling and he also had a fever. He went to an urgent care and was given antibiotics that he started around 2:00 pm 09/28/20. He states his fever has broken and was 98.0 right before he called. He has a dentist appt today at 1:30 in regards to this. Jordan Norton states a callback is not necessary.

## 2020-09-29 NOTE — Telephone Encounter (Signed)
The patient reports he broke a tooth and has been given clinda to take 4 times daily for 5 days. He is going to the dentist this afternoon for a check-up. Discussed patient with Dr. Burt Knack. Reiterated to the patient that while he needs SBE prophylaxis for 6 months post procedure, he will not need extra abx at this time as he is covered with what he is already taking. Will call in SBE prophylaxis to take after this round if the patient requires more visits until May (6 months out from procedure). He was grateful for call and agrees with plan.

## 2020-10-08 ENCOUNTER — Ambulatory Visit: Payer: BC Managed Care – PPO | Admitting: Physician Assistant

## 2020-10-14 ENCOUNTER — Other Ambulatory Visit: Payer: Self-pay

## 2020-10-14 ENCOUNTER — Encounter: Payer: Self-pay | Admitting: Neurology

## 2020-10-14 ENCOUNTER — Ambulatory Visit: Payer: BC Managed Care – PPO | Admitting: Neurology

## 2020-10-14 VITALS — BP 130/68 | HR 72 | Ht 70.0 in | Wt 177.2 lb

## 2020-10-14 DIAGNOSIS — Q2112 Patent foramen ovale: Secondary | ICD-10-CM

## 2020-10-14 DIAGNOSIS — I6930 Unspecified sequelae of cerebral infarction: Secondary | ICD-10-CM

## 2020-10-14 DIAGNOSIS — G40209 Localization-related (focal) (partial) symptomatic epilepsy and epileptic syndromes with complex partial seizures, not intractable, without status epilepticus: Secondary | ICD-10-CM

## 2020-10-14 DIAGNOSIS — Q211 Atrial septal defect: Secondary | ICD-10-CM | POA: Diagnosis not present

## 2020-10-14 MED ORDER — PHENYTOIN SODIUM EXTENDED 200 MG PO CAPS: ORAL_CAPSULE | ORAL | 0 refills | Status: DC

## 2020-10-14 NOTE — Patient Instructions (Signed)
I had a long d/w patient about his remote stroke, rcent endovascular PFO closure,risk for recurrent stroke/TIAs, personally independently reviewed imaging studies and stroke evaluation results and answered questions.Continue aspirin 81 mg and Plavix 75 mg daily for 6 months since PFO closure followed by aspirin alone for secondary stroke prevention and maintain strict control of hypertension with blood pressure goal below 130/90, diabetes with hemoglobin A1c goal below 6.5% and lipids with LDL cholesterol goal below 70 mg/dL. I also advised the patient to eat a healthy diet with plenty of whole grains, cereals, fruits and vegetables, exercise regularly and maintain ideal body weight.  Check lipid profile today.  Check follow-up transcranial Doppler bubble study to look for adequacy of PFO closure.  Patient has had only 2 seizures in his lifetime and the last one being 6 years ago and is on 2 different agents.  I recommend he continue Keppra and the current dose of 100 mg daily but start tapering Dilantin to 200 mg daily for 2 months and 100 mg daily for 2 months and stop.  Followup in the future with my nurse practitioner Janett Billow in 6 months or call earlier if necessary.

## 2020-10-14 NOTE — Progress Notes (Signed)
[facility] [address] [place] [address] [phone]       OFFICE FOLLOW UP VISIT NOTE  [name] Date of Birth:  Jun 29, 1976 Medical Record Number:  [id number]   Referring MD: Jordan Matter, PA-C  Reason for Referral: Headache  AGT:XMIWOE visit 05/28/2020 Jordan Norton is a 45 year old Caucasian male seen today for consultation visit for new onset headache and vision difficulties.  History is obtained from the patient, review of electronic medical records and I personally reviewed available pertinent imaging films in PACS.  He has past medical history of hypertension, seizures since 2016 well-controlled on Dilantin and Keppra and history of asthma.  He states that to the end of July 2020 when he had an episode of severe bad headache the morning he vomited twice and had to rest all day.  The headache he describes as moderate 7/10 in severity mostly aching but all over.  He denies any light or sound sensitivity.  There were no associated symptoms of blurred vision or focal neurological symptoms.  He rested the whole day.  Next day the headache was better and then gradually resolved.  He however started noticing visual flashes in the right upper quadrant of field of vision.  This lasted several days and subsequently has noticed some loss of vision in this area which has persisted.  He has not had any follow-up brain imaging done.  He denies any prior history of migraines but remembers one episode of somewhat similar headache he had in 2001 which was less severe and he threw up and that lasted a day but was not accompanied by visual symptoms.  He was able to work on that day and not have to lie down.  He does have family history of migraine is in his sister who has 1 or 2 such headaches a month and has some visual symptoms with it.  Patient noticed that after this current episode every time he had caffeine the visual flashes would be triggered so he cut back caffeine they  stopped.  He has a prior history of 2 episodes of seizures in August 2016 which occurred back-to-back few days apart.  He may have had previous seizures in February, March and April that year which had not been recognized.  He had 1 seizure in 2004 and saw Dr. Jannifer Norton.  He was on Dilantin for a while which was stopped.  Dilantin was restarted following his seizures in 2016. MRI scan of the brain and EEG done in the office in September 2016 were both normal except for tiny to nonspecific white Norton hyperintensities of unclear significance.  He has also remained on Keppra XR 500mg  daily in addition to Dilantin ER 300 mg daily and tolerating both medications well without side effects.  His last Dilantin level on 10/01/2019 was 8.2.  CBC and BMP done on 04/01/2020 were both unremarkable.  He has no complaints today Update 10/14/2020: He returns for follow-up after last visit 3 months ago.  He had lab work done at last visit which showed LDL cholesterol of 109 mg percent hemoglobin A1c of 4.9.  He has been started on low-fat diet and plan is to repeat follow-up lipid profile today and start statin if LDL still suboptimal.  He transcranial Doppler bubble study done as an outpatient on 06/04/2020 which showed medium size right-to-left shunt.  This was subsequently confirmed on TEE on 07/01/2020 which also showed atrial septal aneurysm.  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  Patient apparently had no clinical symptomatology from this.  MRI of the brain and neck showed no significant large vessel stenosis or occlusion.  He was referred to Dr. Burt Norton did successful endovascular PFO closure on 07/16/2020.  Procedure was complicated by retroperitoneal hematoma which patient has improved.  He is on dual antiplatelet therapy aspirin Plavix and tolerating it well without bruising or bleeding.  States his blood pressure is under good control and today it is 130/68.  He has not had any  seizures now for last 6 years but remains on Dilantin 300 mg extended release daily and Keppra Exar 500 mg daily.  Working full-time and has no complaints. ROS:   14 system review of systems is positive for headache, vision flashes, vision loss, seizures and all other systems negative  PMH:  Past Medical History:  Diagnosis Date  . Asthma   . Hypertension   . Seizures ([place])     Social History:  Social History   Socioeconomic History  . Marital status: Single    Spouse name: Not on file  . Number of children: Not on file  . Years of education: 29  . Highest education level: Not on file  Occupational History  . 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 Smoking status: Never Smoker  . Smokeless tobacco: Never Used  Substance and Sexual Activity  . Alcohol use: No  . Drug use: Yes    Types: Marijuana  . Sexual activity: Never  Other Topics Concern  . Not on file  Social History Narrative   Lives alone in a home    Drinks 1-3 cups caffeine daily   Right Handed   Social Determinants of Health   Financial Resource Strain: Not on file  Food Insecurity: Not on file  Transportation Needs: Not on file  Physical Activity: Not on file  Stress: Not on file  Social Connections: Not on file  Intimate Partner Violence: Not on file    Medications:   Current Outpatient Medications on File Prior to Visit  Medication Sig Dispense Refill  . acetaminophen (TYLENOL) 500 MG tablet Take 500-1,000 mg by mouth every 6 (six) hours as needed.    Marland Kitchen albuterol (VENTOLIN HFA) 108 (90 Base) MCG/ACT inhaler Inhale 2 puffs into the lungs every 6 (six) hours as needed for wheezing or shortness of breath.     Marland Kitchen aspirin EC 81 MG tablet Take 1 tablet (81 mg total) by mouth daily. Swallow whole. 150 tablet 2  . atorvastatin (LIPITOR) 40 MG tablet Take 1 tablet (40 mg total) by mouth daily. 30 tablet 11  . azithromycin (ZITHROMAX) 500 MG tablet Take one tablet (500 mg) one hour prior to all  dental visits prior to 01/13/2021. 6 tablet 0  . clopidogrel (PLAVIX) 75 MG tablet Take 1 tablet (75 mg total) by mouth daily. 90 tablet 1  . fluticasone (FLONASE) 50 MCG/ACT nasal spray Place 1 spray into the nose daily.    . fluticasone (FLOVENT HFA) 44 MCG/ACT inhaler Inhale 2 puffs into the lungs daily.     Marland Kitchen levETIRAcetam (KEPPRA XR) 500 MG 24 hr tablet Take 1 tablet (500 mg total) by mouth daily. 90 tablet 3  . loratadine (CLARITIN) 10 MG tablet Take 10 mg by mouth daily.     Marland Kitchen losartan (COZAAR) 50 MG tablet Take 50 mg by mouth daily.    . Multiple Vitamin (MULTIVITAMIN WITH MINERALS) TABS tablet Take 1 tablet by mouth daily.    Marland Kitchen  pantoprazole (PROTONIX) 40 MG tablet Take 40 mg by mouth daily.  3   No current facility-administered medications on file prior to visit.    Allergies:   Allergies  Allergen Reactions  . Eggs Or Egg-Derived Products     Asthma or hives or rash Cannot take Flu vaccine due to egg allergy  . Hydrocodone-Acetaminophen Other (See Comments)    unknown  . Levofloxacin Other (See Comments)    unknown  . Penicillins Hives and Swelling    Childhood    Physical Exam General: well developed, well nourished middle-aged Caucasian male, seated, in no evident distress Head: head normocephalic and atraumatic.   Neck: supple with no carotid or supraclavicular bruits Cardiovascular: regular rate and rhythm, no murmurs Musculoskeletal: no deformity Skin:  no rash/petichiae Vascular:  Normal pulses all extremities  Neurologic Exam Mental Status: Awake and fully alert. Oriented to place and time. Recent and remote memory intact. Attention span, concentration and fund of knowledge appropriate. Mood and affect appropriate.  Cranial Nerves: Fundoscopic exam reveals sharp disc margins. Pupils equal, briskly reactive to light. Extraocular movements full without nystagmus. Visual fields full show right superior quadrantanopsia to confrontation. Hearing intact. Facial  sensation intact. Face, tongue, palate moves normally and symmetrically.  Motor: Normal bulk and tone. Normal strength in all tested extremity muscles. Sensory.: intact to touch , pinprick , position and vibratory sensation.  Coordination: Rapid alternating movements normal in all extremities. Finger-to-nose and heel-to-shin performed accurately bilaterally. Gait and Station: Arises from chair without difficulty. Stance is normal. Gait demonstrates normal stride length and balance . Able to heel, toe and tandem walk without difficulty.  Reflexes: 1+ and symmetric. Toes downgoing.   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  No significant vascular risk factors except borderline hyperlipidemia and PFO for which she has undergone successful endovascular PFO closure on 07/16/2020.Marland Kitchen  Remote history of seizures 1st in 2004 and subsequent recurrence in 2016 which appear to be well controlled on the current medication regimen of Keppra and Dilantin.     PLAN: I had a long d/w patient about his remote stroke, rcent endovascular PFO closure,risk for recurrent stroke/TIAs, personally independently reviewed imaging studies and stroke evaluation results and answered questions.Continue aspirin 81 mg and Plavix 75 mg daily for 6 months since PFO closure followed by aspirin alone for secondary stroke prevention and maintain strict control of hypertension with blood pressure goal below 130/90, diabetes with hemoglobin A1c goal below 6.5% and lipids with LDL cholesterol goal below 70 mg/dL. I also advised the patient to eat a healthy diet with plenty of whole grains, cereals, fruits and vegetables, exercise regularly and maintain ideal body weight.  Check lipid profile today.  Check follow-up transcranial Doppler bubble study to look for  adequacy of PFO closure.  Patient has had only 2 seizures in his lifetime and the last one being 6 years ago and is on 2 different agents.  I recommend he continue Keppra and the current dose of 100 mg daily but start tapering Dilantin to 200 mg daily for 2 months and 100 mg daily for 2 months and stop.  Followup in the future with my nurse practitioner Janett Billow in 6 months or call earlier if necessary.Greater than 50% time during this 35-minute   visit was spent on counseling and coordination of care about his  headache and right-sided vision loss discussion about seizure prevention and answering questions he will return for follow-up in 3 months or call earlier if necessary. Antony Contras, MD  [facility] [address] [place], [place] [address]  Phone [phone] Fax [phone] Note: This document was prepared with digital dictation and possible smart phrase technology. Any transcriptional errors that result from this process are unintentional.

## 2020-10-15 LAB — LIPID PANEL
Chol/HDL Ratio: 2.5 ratio (ref 0.0–5.0)
Cholesterol, Total: 163 mg/dL (ref 100–199)
HDL: 66 mg/dL (ref >39)
LDL Chol Calc (NIH): 76 mg/dL (ref 0–99)
Triglycerides: 120 mg/dL (ref 0–149)
VLDL Cholesterol Cal: 21 mg/dL (ref 5–40)

## 2020-10-21 NOTE — Progress Notes (Signed)
Kindly inform the patient that cholesterol profile was satisfactory with bad cholesterol being borderline but overall improved from 4 months ago.  Nothing to worry about

## 2020-10-24 ENCOUNTER — Ambulatory Visit (HOSPITAL_COMMUNITY)
Admission: RE | Admit: 2020-10-24 | Discharge: 2020-10-24 | Disposition: A | Discharge status: Home / Self Care | Payer: BC Managed Care – PPO | Source: Ambulatory Visit | Attending: Neurology | Admitting: Neurology

## 2020-10-24 ENCOUNTER — Other Ambulatory Visit: Payer: Self-pay

## 2020-10-24 DIAGNOSIS — I6930 Unspecified sequelae of cerebral infarction: Secondary | ICD-10-CM | POA: Insufficient documentation

## 2020-10-24 DIAGNOSIS — Q211 Atrial septal defect: Secondary | ICD-10-CM | POA: Insufficient documentation

## 2020-10-24 DIAGNOSIS — Q2112 Patent foramen ovale: Secondary | ICD-10-CM

## 2020-10-27 ENCOUNTER — Ambulatory Visit: Payer: BC Managed Care – PPO | Admitting: Neurology

## 2020-10-27 NOTE — Progress Notes (Signed)
I personally informed the patient about the TCD bubble study report showing small residual right-to-left shunt despite endovascular closure but this is unlikely to be of any clinical significance and recommend repeat study in 6 months.  Patient voiced understanding

## 2020-10-29 ENCOUNTER — Telehealth: Payer: Self-pay | Admitting: *Deleted

## 2020-10-29 NOTE — Telephone Encounter (Signed)
-----   Message from Garvin Fila, MD sent at 10/21/2020  4:17 PM EST ----- Kindly inform the patient that cholesterol profile was satisfactory with bad cholesterol being borderline but overall improved from 4 months ago.  Nothing to worry about

## 2020-10-29 NOTE — Telephone Encounter (Signed)
I spoke to the patient and he verbalized understanding of the lab results.

## 2021-01-01 ENCOUNTER — Ambulatory Visit: Payer: BC Managed Care – PPO | Admitting: Family Medicine

## 2021-01-27 ENCOUNTER — Telehealth: Payer: Self-pay | Admitting: Neurology

## 2021-01-27 ENCOUNTER — Other Ambulatory Visit: Payer: Self-pay | Admitting: Neurology

## 2021-01-27 DIAGNOSIS — G40209 Localization-related (focal) (partial) symptomatic epilepsy and epileptic syndromes with complex partial seizures, not intractable, without status epilepticus: Secondary | ICD-10-CM

## 2021-01-27 NOTE — Telephone Encounter (Signed)
Returned [employer] and confirmed that is the dosage for the next 2 months.    Patient denied further questions, verbalized understanding and expressed appreciation for the phone call.

## 2021-01-27 NOTE — Telephone Encounter (Signed)
[facility]) called, wanting to confirm intentional decrease for phenytoin (DILANTIN) 100 MG ER capsule. Would like a call from the nurse.

## 2021-03-13 ENCOUNTER — Telehealth: Payer: Self-pay | Admitting: Neurology

## 2021-03-13 DIAGNOSIS — G40209 Localization-related (focal) (partial) symptomatic epilepsy and epileptic syndromes with complex partial seizures, not intractable, without status epilepticus: Secondary | ICD-10-CM

## 2021-03-13 MED ORDER — LEVETIRACETAM ER 500 MG PO TB24: 500.0000 mg | ORAL_TABLET | Freq: Every day | ORAL | 0 refills | Status: DC

## 2021-03-13 NOTE — Addendum Note (Signed)
Addended by: Noberto Retort C on: 03/13/2021 02:51 PM   Modules accepted: Orders

## 2021-03-13 NOTE — Telephone Encounter (Signed)
Pending appt 04/13/2021. Refill sent to requested pharmacy.

## 2021-03-13 NOTE — Telephone Encounter (Signed)
Pt is requesting a refill for levETIRAcetam (KEPPRA XR) 500 MG 24 hr tablet.  Pharmacy: [facility]

## 2021-04-13 ENCOUNTER — Encounter: Payer: Self-pay | Admitting: Adult Health

## 2021-04-13 ENCOUNTER — Other Ambulatory Visit: Payer: Self-pay

## 2021-04-13 ENCOUNTER — Ambulatory Visit: Payer: BC Managed Care – PPO | Admitting: Adult Health

## 2021-04-13 VITALS — BP 118/69 | HR 59 | Ht 71.0 in | Wt 180.5 lb

## 2021-04-13 DIAGNOSIS — I6381 Other cerebral infarction due to occlusion or stenosis of small artery: Secondary | ICD-10-CM

## 2021-04-13 DIAGNOSIS — Q2112 Patent foramen ovale: Secondary | ICD-10-CM

## 2021-04-13 DIAGNOSIS — Q211 Atrial septal defect: Secondary | ICD-10-CM

## 2021-04-13 DIAGNOSIS — I639 Cerebral infarction, unspecified: Secondary | ICD-10-CM | POA: Diagnosis not present

## 2021-04-13 DIAGNOSIS — G40209 Localization-related (focal) (partial) symptomatic epilepsy and epileptic syndromes with complex partial seizures, not intractable, without status epilepticus: Secondary | ICD-10-CM | POA: Diagnosis not present

## 2021-04-13 MED ORDER — LEVETIRACETAM ER 500 MG PO TB24: 500.0000 mg | ORAL_TABLET | Freq: Every day | ORAL | 3 refills | Status: DC

## 2021-04-13 MED ORDER — ATORVASTATIN CALCIUM 40 MG PO TABS: 40.0000 mg | ORAL_TABLET | Freq: Every day | ORAL | 3 refills | Status: DC

## 2021-04-13 NOTE — Progress Notes (Signed)
[facility] [address] [place]. [place] [address] [phone]       OFFICE FOLLOW UP VISIT NOTE  [name] Date of Birth:  1975/11/20 Medical Record Number:  [id number]   Referring MD: Bing Matter, PA-C  Reason for Referral: Stroke and seizure   Chief Complaint  Patient presents with   Follow-up    Rm 2 here for 6 month f/u. Pt reports he has been doing well, denies any seizure like activity. Would like to discuss if PCP needs to refill atorvastatin or if we could continue to refill ( needs a 90 day supply) also would like to discuss repeating the bubble study as recommended by Dr. Leonie Man at the time of his previous test.      HPI:  Today, 04/13/2021, Jordan Norton returns for 45-monthstroke and seizure follow-up unaccompanied.  Overall stable since prior visit.  Denies new stroke/TIA symptoms.  Residual right upper visual impairment - stable without worsening. Typically not noticeable deficit but at times can be noticeable such as when he is riding his mountain bike.  Compliant on aspirin and atorvastatin -denies associated side effects.  Blood pressure today 118/69.  TCD 10/2020 showed small residual right to left shunt post endovascular closure in 07/2020 but unlikely to be of any clinical significance.  Remains on Keppra XR without any recent seizure activity.  He has since weaned off from Dilantin without difficulty.  No new concerns at this time.     History provided for reference purposes only Update 10/14/2020 Dr. SLeonie Man He returns for follow-up after last visit 3 months ago.  He had lab work done at last visit which showed LDL cholesterol of 109 mg percent hemoglobin A1c of 4.9.  He has been started on low-fat diet and plan is to repeat follow-up lipid profile today and start statin if LDL still suboptimal.  He transcranial Doppler bubble study done as an outpatient on 06/04/2020 which showed medium size right-to-left shunt.  This was subsequently confirmed  on TEE on 07/01/2020 which also showed atrial septal aneurysm.  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  Patient apparently had no clinical symptomatology from this.  MRI of the brain and neck showed no significant large vessel stenosis or occlusion.  He was referred to Dr. CBurt Knackdid successful endovascular PFO closure on 07/16/2020.  Procedure was complicated by retroperitoneal hematoma which patient has improved.  He is on dual antiplatelet therapy aspirin Plavix and tolerating it well without bruising or bleeding.  States his blood pressure is under good control and today it is 130/68.  He has not had any seizures now for last 6 years but remains on Dilantin 300 mg extended release daily and Keppra Exar 500 mg daily.  Working full-time and has no complaints.  Inital visit 05/28/2020 Dr. SLeonie Man  Jordan Norton a 45year old Caucasian male seen today for consultation visit for new onset headache and vision difficulties.  History is obtained from the patient, review of electronic medical records and I personally reviewed available pertinent imaging films in PACS.  He has past medical history of hypertension, seizures since 2016 well-controlled on Dilantin and Keppra and history of asthma.  He states that to the end of July 2020 when he had an episode of severe bad headache the morning he vomited twice and had to rest all day.  The headache he describes as moderate 7/10 in severity mostly aching but all over.  He denies any light  or sound sensitivity.  There were no associated symptoms of blurred vision or focal neurological symptoms.  He rested the whole day.  Next day the headache was better and then gradually resolved.  He however started noticing visual flashes in the right upper quadrant of field of vision.  This lasted several days and subsequently has noticed some loss of vision in this area which has persisted.  He has not had any follow-up brain imaging done.  He  denies any prior history of migraines but remembers one episode of somewhat similar headache he had in 2001 which was less severe and he threw up and that lasted a day but was not accompanied by visual symptoms.  He was able to work on that day and not have to lie down.  He does have family history of migraine is in his sister who has 1 or 2 such headaches a month and has some visual symptoms with it.  Patient noticed that after this current episode every time he had caffeine the visual flashes would be triggered so he cut back caffeine they stopped.  He has a prior history of 2 episodes of seizures in August 2016 which occurred back-to-back few days apart.  He may have had previous seizures in February, March and April that year which had not been recognized.  He had 1 seizure in 2004 and saw Dr. Jannifer Franklin.  He was on Dilantin for a while which was stopped.  Dilantin was restarted following his seizures in 2016. MRI scan of the brain and EEG done in the office in September 2016 were both normal except for tiny to nonspecific white matter hyperintensities of unclear significance.  He has also remained on Keppra XR '500mg'$  daily in addition to Dilantin ER 300 mg daily and tolerating both medications well without side effects.  His last Dilantin level on 10/01/2019 was 8.2.  CBC and BMP done on 04/01/2020 were both unremarkable.  He has no complaints today     ROS:   14 system review of systems is positive for those listed in HPI and all other systems negative  PMH:  Past Medical History:  Diagnosis Date   Asthma    Hypertension    Seizures ([place])     Social History:  Social History   Socioeconomic History   Marital status: Single    Spouse name: Not on file   Number of children: Not on file   Years of education: 12   Highest education level: Not on file  Occupational History   Occupation: [employer] engineer   Tobacco Use   Smoking status: Never   Smokeless tobacco: Never  Substance and  Sexual Activity   Alcohol use: No   Drug use: Yes    Types: Marijuana   Sexual activity: Never  Other Topics Concern   Not on file  Social History Narrative   Lives alone in a home    Drinks 1-3 cups caffeine daily   Right Handed   Social Determinants of Health   Financial Resource Strain: Not on file  Food Insecurity: Not on file  Transportation Needs: Not on file  Physical Activity: Not on file  Stress: Not on file  Social Connections: Not on file  Intimate Partner Violence: Not on file    Medications:   Current Outpatient Medications on File Prior to Visit  Medication Sig Dispense Refill   albuterol (VENTOLIN HFA) 108 (90 Base) MCG/ACT inhaler Inhale 2 puffs into the lungs every 6 (six) hours  as needed for wheezing or shortness of breath.      aspirin EC 81 MG tablet Take 1 tablet (81 mg total) by mouth daily. Swallow whole. 150 tablet 2   atorvastatin (LIPITOR) 40 MG tablet Take 1 tablet (40 mg total) by mouth daily. 30 tablet 11   fluticasone (FLONASE) 50 MCG/ACT nasal spray Place 1 spray into the nose daily.     fluticasone (FLOVENT HFA) 44 MCG/ACT inhaler Inhale 2 puffs into the lungs daily.      levETIRAcetam (KEPPRA XR) 500 MG 24 hr tablet Take 1 tablet (500 mg total) by mouth daily. 90 tablet 0   loratadine (CLARITIN) 10 MG tablet Take 10 mg by mouth daily.      losartan (COZAAR) 50 MG tablet Take 50 mg by mouth daily.     Multiple Vitamin (MULTIVITAMIN WITH MINERALS) TABS tablet Take 1 tablet by mouth daily.     pantoprazole (PROTONIX) 40 MG tablet Take 40 mg by mouth daily.  3   No current facility-administered medications on file prior to visit.    Allergies:   Allergies  Allergen Reactions   Eggs Or Egg-Derived Products     Asthma or hives or rash Cannot take Flu vaccine due to egg allergy   Hydrocodone-Acetaminophen Other (See Comments)    unknown   Levofloxacin Other (See Comments)    unknown   Penicillins Hives and Swelling    Childhood     Physical Exam Today's Vitals   04/13/21 1430  BP: 118/69  Pulse: (!) 59  Weight: 180 lb 8 oz (81.9 kg)  Height: '5\' 11"'$  (1.803 m)   Body mass index is 25.17 kg/m.   General: well developed, well nourished middle-aged Caucasian male, seated, in no evident distress Head: head normocephalic and atraumatic.   Neck: supple with no carotid or supraclavicular bruits Cardiovascular: regular rate and rhythm, no murmurs Musculoskeletal: no deformity Skin:  no rash/petichiae Vascular:  Normal pulses all extremities  Neurologic Exam Mental Status: Awake and fully alert. Oriented to place and time. Recent and remote memory intact. Attention span, concentration and fund of knowledge appropriate. Mood and affect appropriate.  Cranial Nerves: Pupils equal, briskly reactive to light. Extraocular movements full without nystagmus. Visual fields full show right partial superior quadrantanopsia to confrontation. Hearing intact. Facial sensation intact. Face, tongue, palate moves normally and symmetrically.  Motor: Normal bulk and tone. Normal strength in all tested extremity muscles. Sensory.: intact to touch , pinprick , position and vibratory sensation.  Coordination: Rapid alternating movements normal in all extremities. Finger-to-nose and heel-to-shin performed accurately bilaterally. Gait and Station: Arises from chair without difficulty. Stance is normal. Gait demonstrates normal stride length and balance without use of assistive device. Able to heel, toe and tandem walk without difficulty.  Reflexes: 1+ and symmetric. Toes downgoing.        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  No significant vascular risk factors except borderline hyperlipidemia and PFO for which he has undergone successful endovascular PFO  closure on 07/16/2020.  Remote history of seizures 1st in 2004 and subsequent recurrence in 2016      PLAN:  1.  Left thalamic infarct -Continue aspirin 81 mg and atorvastatin 40 mg daily for secondary stroke prevention and  -Close PCP follow-up to maintain strict control of HLD with LDL cholesterol goal below 70 mg/dL.  -  Lipid panel 10/2020 LDL 76 -atorvastatin refill provided  2.  PFO -s/p closure 07/16/2020 by Dr. Burt Knack -TCD 10/24/2020 positive bubble study small residual right-to-left shunt s/p closure likely to be clinically insignificant -per Dr. Clydene Fake recommendations, repeat TCD bubble study as it has been 6 months since prior exam -f/u with cards as scheduled in 08/2021  3.  Seizure disorder -Continue Keppra XR 500 mg daily for seizure prophylaxis -refill provided -weaned off from Dilantin as instructed by Dr. Leonie Man as stable without seizures on dual therapy for over 6 years    Follow-up in 1 year or call earlier if needed     CC:  [place] provider: Dr. Blima Dessert, Baldemar Friday., PA-C   I spent 33 minutes of face-to-face and non-face-to-face time with patient.  This included previsit chart review, lab review, study review, order entry, electronic health record documentation, patient education and discussion regarding history of stroke and residual deficits, secondary stroke prevention measures and aggressive stroke risk factor management, history of PFO s/p closure and further testing, hx of seizure disorder and use of AED and answered all other questions to patient satisfaction  Frann Rider, AGNP-BC  [facility] [address] [place], [place] [address]  Phone [phone] Fax [phone] Note: This document was prepared with digital dictation and possible smart phrase technology. Any transcriptional errors that result from this process are unintentional.

## 2021-04-13 NOTE — Patient Instructions (Addendum)
Your Plan:  Continue keppra XR '500mg'$  daily for seizure prevention  We will repeat transcranial doppler to follow up on previously closed PFO - you will be called to schedule  You will follow up with cardiology in December as scheduled    Follow up in a year or call earlier if needed      Thank you for coming to see [place] at [facility]. I hope we have been able to provide you high quality care today.  You may receive a patient satisfaction survey over the next few weeks. We would appreciate your feedback and comments so that we may continue to improve ourselves and the health of our patients.

## 2021-04-14 ENCOUNTER — Ambulatory Visit: Payer: BC Managed Care – PPO | Admitting: Neurology

## 2021-04-15 ENCOUNTER — Telehealth: Payer: Self-pay | Admitting: Adult Health

## 2021-04-15 NOTE — Telephone Encounter (Signed)
[employer] no auth require ref [employer] F on 04/15/21. Sent message to Butch Penny with [facility] they will reach out to the patient to schedule.

## 2021-04-16 ENCOUNTER — Telehealth: Payer: Self-pay | Admitting: Adult Health

## 2021-04-16 NOTE — Telephone Encounter (Signed)
Pt states he is scheduled to have an EKG in Nov. He is asking if he still needs to keep the 8-19 appointment if both will give similar results.  Pt states he is willing to do both if Janett Billow, NP would suggest, he would just like a call with a response.

## 2021-04-16 NOTE — Telephone Encounter (Signed)
Patient is scheduled at [facility] for 04/24/21.

## 2021-04-16 NOTE — Telephone Encounter (Addendum)
Pt notified of JM, NP response. Pt verbalized understanding and will check in with cardiology if scan in November will still be necessary.

## 2021-04-16 NOTE — Telephone Encounter (Signed)
The 6 month repeat TCD was recommended by Dr. Leonie Man - he can reach out to cardiology for their opinion to see if the TCD needs to be pursued as well. Thank you

## 2021-04-20 NOTE — Progress Notes (Signed)
I agree with the above plan 

## 2021-04-24 ENCOUNTER — Ambulatory Visit (HOSPITAL_COMMUNITY)
Admission: RE | Admit: 2021-04-24 | Discharge: 2021-04-24 | Disposition: A | Discharge status: Home / Self Care | Payer: BC Managed Care – PPO | Source: Ambulatory Visit | Attending: Adult Health | Admitting: Adult Health

## 2021-04-24 ENCOUNTER — Other Ambulatory Visit: Payer: Self-pay

## 2021-04-24 DIAGNOSIS — Q211 Atrial septal defect: Secondary | ICD-10-CM | POA: Diagnosis not present

## 2021-04-24 NOTE — Progress Notes (Signed)
TCD with bubbles has been completed. Preliminary results can be found in CV Proc through chart review.   04/24/21 2:15 PM Jordan Norton RVT

## 2021-04-28 ENCOUNTER — Telehealth: Payer: Self-pay | Admitting: *Deleted

## 2021-04-28 NOTE — Telephone Encounter (Signed)
Spoke with patient and advised him that recent TCD showed improvement from prior testing and Dr. Leonie Man recommends repeating in 6 months. He will also still f/u with Dr. Burt Knack for repeat echocardiogram as previously advised. Patient verbalized understanding, appreciation.

## 2021-04-30 ENCOUNTER — Other Ambulatory Visit: Payer: Self-pay

## 2021-04-30 DIAGNOSIS — Q211 Atrial septal defect: Secondary | ICD-10-CM

## 2021-04-30 DIAGNOSIS — Q2112 Patent foramen ovale: Secondary | ICD-10-CM

## 2021-05-18 ENCOUNTER — Ambulatory Visit (HOSPITAL_COMMUNITY): Payer: BC Managed Care – PPO | Attending: Cardiology

## 2021-05-18 ENCOUNTER — Ambulatory Visit: Payer: BC Managed Care – PPO | Admitting: Cardiovascular Disease

## 2021-05-18 ENCOUNTER — Encounter: Payer: Self-pay | Admitting: Cardiovascular Disease

## 2021-05-18 ENCOUNTER — Other Ambulatory Visit (HOSPITAL_COMMUNITY): Payer: BC Managed Care – PPO

## 2021-05-18 ENCOUNTER — Other Ambulatory Visit: Payer: Self-pay

## 2021-05-18 VITALS — BP 124/72 | HR 48 | Ht 70.0 in | Wt 179.0 lb

## 2021-05-18 DIAGNOSIS — Q211 Atrial septal defect: Secondary | ICD-10-CM | POA: Diagnosis present

## 2021-05-18 DIAGNOSIS — Z8774 Personal history of (corrected) congenital malformations of heart and circulatory system: Secondary | ICD-10-CM

## 2021-05-18 DIAGNOSIS — Q2112 Patent foramen ovale: Secondary | ICD-10-CM

## 2021-05-18 LAB — ECHOCARDIOGRAM LIMITED BUBBLE STUDY
Area-P 1/2: 3.95 cm2
S' Lateral: 3.2 cm

## 2021-05-18 NOTE — Patient Instructions (Signed)
Medication Instructions:  Your physician recommends that you continue on your current medications as directed. Please refer to the Current Medication list given to you today.  *If you need a refill on your cardiac medications before your next appointment, please call your pharmacy*   Lab Work: none If you have labs (blood work) drawn today and your tests are completely normal, you will receive your results only by: [place] (if you have MyChart) OR A paper copy in the mail If you have any lab test that is abnormal or we need to change your treatment, we will call you to review the results.   Testing/Procedures: none   Follow-Up: At [facility], you and your health needs are our priority.  As part of our continuing mission to provide you with exceptional heart care, we have created designated Provider Care Teams.  These Care Teams include your primary Cardiologist (physician) and Advanced Practice Providers (APPs -  Physician Assistants and Nurse Practitioners) who all work together to provide you with the care you need, when you need it.  We recommend signing up for the patient portal called "MyChart".  Sign up information is provided on this After Visit Summary.  MyChart is used to connect with patients for Virtual Visits (Telemedicine).  Patients are able to view lab/test results, encounter notes, upcoming appointments, etc.  Non-urgent messages can be sent to your provider as well.   To learn more about what you can do with MyChart, go to [email or web address].    Your next appointment:   12 month(s)  The format for your next appointment:   In Person  Provider:   You may see Dr Burt Knack or one of the following Advanced Practice Providers on your designated Care Team:   Richardson Dopp, PA-C Robbie Lis, [place]   Other Instructions

## 2021-05-18 NOTE — Progress Notes (Signed)
Cardiology Office Note:    Date:  05/18/2021   ID:  Jordan Norton, DOB 01-30-1976, MRN [id number]  PCP:  Aletha Halim., PA-C   [facility] Providers Cardiologist:  Sherren Mocha, MD     Referring MD: Aletha Halim., PA-C   Chief Complaint  Patient presents with   PFO     History of Present Illness:    Jordan Norton is a 45 y.o. male with a hx of cryptogenic stroke who underwent transcatheter PFO closure July 16, 2020 with a 25 mm Amplatzer PFO occluder.  His procedure was complicated by retroperitoneal hemorrhage.  He did not require repeat hospitalization or blood transfusion.  He fully recovered and denies any problems since that time.  He recently underwent a transcranial Doppler study which demonstrated small to medium right to left intracardiac shunt and he returns today for follow-up evaluation.  We obtained a 2D echo with bubble study today prior to his office visit.  The patient has had some atypical chest pains that he thinks may be muscular.  He otherwise has had no specific complaints.  He denies shortness of breath, heart palpitations, orthopnea, or PND.  Past Medical History:  Diagnosis Date   Asthma    Hypertension    Seizures ([place])     Past Surgical History:  Procedure Laterality Date   BUBBLE STUDY  07/01/2020   Procedure: BUBBLE STUDY;  Surgeon: Donato Heinz, MD;  Location: [place];  Service: Cardiovascular;;   DENTAL SURGERY     PATENT FORAMEN OVALE(PFO) CLOSURE N/A 07/16/2020   Procedure: PATENT FORAMEN OVALE (PFO) CLOSURE;  Surgeon: Sherren Mocha, MD;  Location: [place] CV LAB;  Service: Cardiovascular;  Laterality: N/A;   TEE WITHOUT CARDIOVERSION N/A 07/01/2020   Procedure: TRANSESOPHAGEAL ECHOCARDIOGRAM (TEE);  Surgeon: Donato Heinz, MD;  Location: [facility] ENDOSCOPY;  Service: Cardiovascular;  Laterality: N/A;    Current Medications: Current Meds  Medication Sig   albuterol (VENTOLIN HFA) 108 (90 Base)  MCG/ACT inhaler Inhale 2 puffs into the lungs every 6 (six) hours as needed for wheezing or shortness of breath.    aspirin EC 81 MG tablet Take 1 tablet (81 mg total) by mouth daily. Swallow whole.   atorvastatin (LIPITOR) 40 MG tablet Take 1 tablet (40 mg total) by mouth daily.   fluticasone (FLONASE) 50 MCG/ACT nasal spray Place 1 spray into the nose daily.   fluticasone (FLOVENT HFA) 44 MCG/ACT inhaler Inhale 2 puffs into the lungs daily.    levETIRAcetam (KEPPRA XR) 500 MG 24 hr tablet Take 1 tablet (500 mg total) by mouth daily.   loratadine (CLARITIN) 10 MG tablet Take 10 mg by mouth daily.    losartan (COZAAR) 50 MG tablet Take 50 mg by mouth daily.   Multiple Vitamin (MULTIVITAMIN WITH MINERALS) TABS tablet Take 1 tablet by mouth daily.   pantoprazole (PROTONIX) 40 MG tablet Take 40 mg by mouth daily.     Allergies:   Eggs or egg-derived products, Hydrocodone-acetaminophen, Levofloxacin, and Penicillins   Social History   Socioeconomic History   Marital status: Single    Spouse name: Not on file   Number of children: Not on file   Years of education: 12   Highest education level: Not on file  Occupational History   Occupation: [employer]   Tobacco Use   Smoking status: Never   Smokeless tobacco: Never  Substance and Sexual Activity   Alcohol use: No   Drug use: Yes    Types:  Marijuana   Sexual activity: Never  Other Topics Concern   Not on file  Social History Narrative   Lives alone in a home    Drinks 1-3 cups caffeine daily   Right Handed   Social Determinants of Health   Financial Resource Strain: Not on file  Food Insecurity: Not on file  Transportation Needs: Not on file  Physical Activity: Not on file  Stress: Not on file  Social Connections: Not on file     Family History: The patient's family history includes Cancer in his maternal uncle and mother; Diabetes in his paternal grandmother; Heart disease in his maternal grandfather;  Hypertension in his father; Parkinsonism in his maternal grandmother and mother.  ROS:   Please see the history of present illness.    All other systems reviewed and are negative.  EKGs/Labs/Other Studies Reviewed:    The following studies were reviewed today: TCD Bubble Study 04/24/21: Summary:       Positive TCD Bubble study indicative of a medium sized right to left shunt  *See table(s) above for TCD measurements and observations.   Echo with Bubble (today): IMPRESSIONS     1. Left ventricular ejection fraction, by estimation, is 55 to 60%. The  left ventricle has normal function. The left ventricle has no regional  wall motion abnormalities. Left ventricular diastolic parameters were  normal.   2. Right ventricular systolic function is normal. The right ventricular  size is normal. Tricuspid regurgitation signal is inadequate for assessing  PA pressure.   3. The mitral valve is normal in structure. Trivial mitral valve  regurgitation. No evidence of mitral stenosis.   4. The aortic valve is tricuspid. Aortic valve regurgitation is not  visualized. No aortic stenosis is present.   5. The inferior vena cava is normal in size with greater than 50%  respiratory variability, suggesting right atrial pressure of 3 mmHg.   6. PFO closure device visualized, no residual shunt noted by color  doppler or bubble study.    EKG:  EKG is ordered today.  The ekg ordered today demonstrates sinus bradycardia 48 bpm, otherwise within normal limits.  Recent Labs: 07/23/2020: BUN 7; Creatinine, Ser 0.64; Potassium 4.5; Sodium 140 08/18/2020: Hemoglobin 14.3; Platelets 310  Recent Lipid Panel    Component Value Date/Time   CHOL 163 10/14/2020 1358   TRIG 120 10/14/2020 1358   HDL 66 10/14/2020 1358   CHOLHDL 2.5 10/14/2020 1358   LDLCALC 76 10/14/2020 1358     Risk Assessment/Calculations:           Physical Exam:    VS:  BP 124/72   Pulse (!) 48   Ht '5\' 10"'$  (1.778 m)   Wt  179 lb (81.2 kg)   SpO2 99%   BMI 25.68 kg/m     Wt Readings from Last 3 Encounters:  05/18/21 179 lb (81.2 kg)  04/13/21 180 lb 8 oz (81.9 kg)  10/14/20 177 lb 3.2 oz (80.4 kg)     GEN:  Well nourished, well developed in no acute distress HEENT: Normal NECK: No JVD; No carotid bruits LYMPHATICS: No lymphadenopathy CARDIAC: RRR, no murmurs, rubs, gallops RESPIRATORY:  Clear to auscultation without rales, wheezing or rhonchi  ABDOMEN: Soft, non-tender, non-distended MUSCULOSKELETAL:  No edema; No deformity  SKIN: Warm and dry NEUROLOGIC:  Alert and oriented x 3 PSYCHIATRIC:  Normal affect   ASSESSMENT:    1. PFO (patent foramen ovale)   2. S/P percutaneous patent foramen ovale closure  PLAN:    In order of problems listed above:  The patient has done well following transcatheter PFO closure.  He remains on aspirin 81 mg daily.  I personally reviewed his echo images from today's study.  The atrial septal occluder device is in proper position and there is no residual right to left shunting with a vigorous bubble study done.  I am not sure how to explain the discrepancy between the transcranial Doppler test and the 2D echocardiogram with bubble.  I suspect the transcranial Doppler is just a more sensitive test for small shunts.  There is no mechanical issue that I can see with the PFO device and there is certainly no significant shunting at this point.  The patient should continue on his current medical therapy.  I will see him back in 1 year for follow-up.    Medication Adjustments/Labs and Tests Ordered: Current medicines are reviewed at length with the patient today.  Concerns regarding medicines are outlined above.  Orders Placed This Encounter  Procedures   EKG 12-Lead   No orders of the defined types were placed in this encounter.   Patient Instructions  Medication Instructions:  Your physician recommends that you continue on your current medications as directed.  Please refer to the Current Medication list given to you today.  *If you need a refill on your cardiac medications before your next appointment, please call your pharmacy*   Lab Work: none If you have labs (blood work) drawn today and your tests are completely normal, you will receive your results only by: [place] (if you have MyChart) OR A paper copy in the mail If you have any lab test that is abnormal or we need to change your treatment, we will call you to review the results.   Testing/Procedures: none   Follow-Up: At [facility], you and your health needs are our priority.  As part of our continuing mission to provide you with exceptional heart care, we have created designated Provider Care Teams.  These Care Teams include your primary Cardiologist (physician) and Advanced Practice Providers (APPs -  Physician Assistants and Nurse Practitioners) who all work together to provide you with the care you need, when you need it.  We recommend signing up for the patient portal called "MyChart".  Sign up information is provided on this After Visit Summary.  MyChart is used to connect with patients for Virtual Visits (Telemedicine).  Patients are able to view lab/test results, encounter notes, upcoming appointments, etc.  Non-urgent messages can be sent to your provider as well.   To learn more about what you can do with MyChart, go to [email or web address].    Your next appointment:   12 month(s)  The format for your next appointment:   In Person  Provider:   You may see Dr Burt Knack or one of the following Advanced Practice Providers on your designated Care Team:   Richardson Dopp, PA-C Robbie Lis, [place]   Other Instructions    Signed, Sherren Mocha, MD  05/18/2021 6:20 PM    [place]

## 2021-06-16 IMAGING — CT CT ABD-PELV W/O CM
2 of 4 series · 13 of 46 positions shown, 15 images · non-contrast
Comparison: Scrotal ultrasound 10/11/2013
COMPARISON: Scrotal ultrasound 10/11/2013

Addendum:
CLINICAL DATA: Right lower and left lower quadrant pain/bruising
with testicular swelling status post patent foramen ovale closure on
[DATE].

EXAM:
CT ABDOMEN AND PELVIS WITHOUT CONTRAST
TECHNIQUE: Multidetector CT imaging of the abdomen and pelvis was performed
following the standard protocol without IV contrast.

[Series 2: abd/ pelvis · axial · 0.84mm/px · z∈[-755,-315]mm · 10 of 106 slices shown, 12 images]
[im 9/106  soft-tissue]
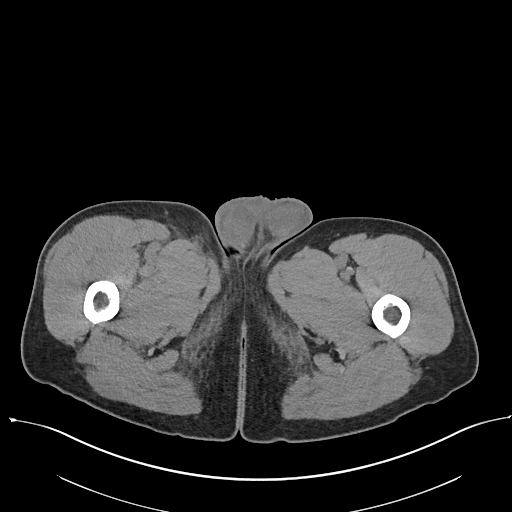
[im 9/106  bone]
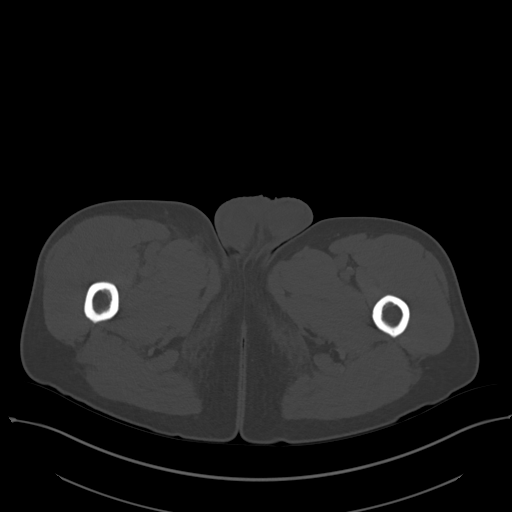
[im 17/106  soft-tissue]
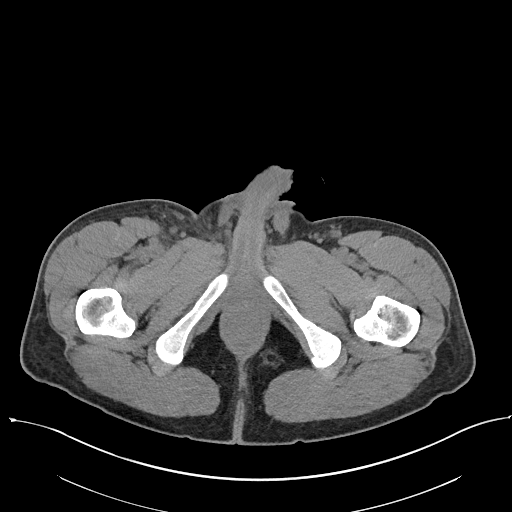
[im 30/106  soft-tissue]
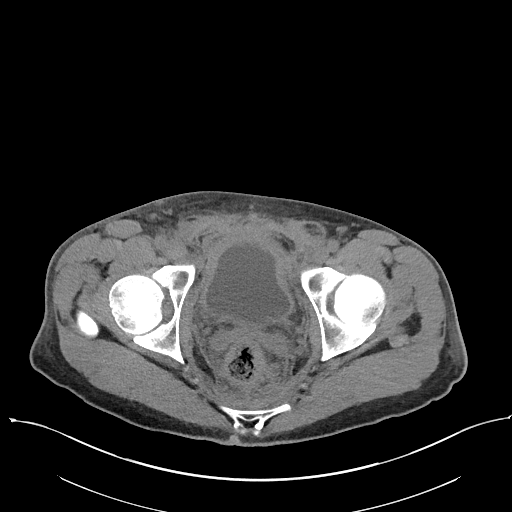
[im 38/106  soft-tissue]
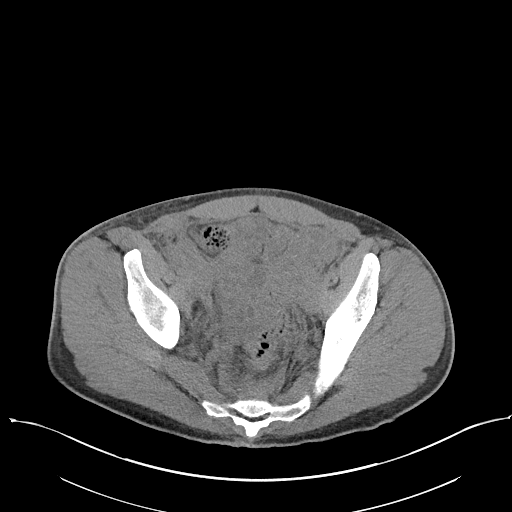
[im 47/106  soft-tissue]
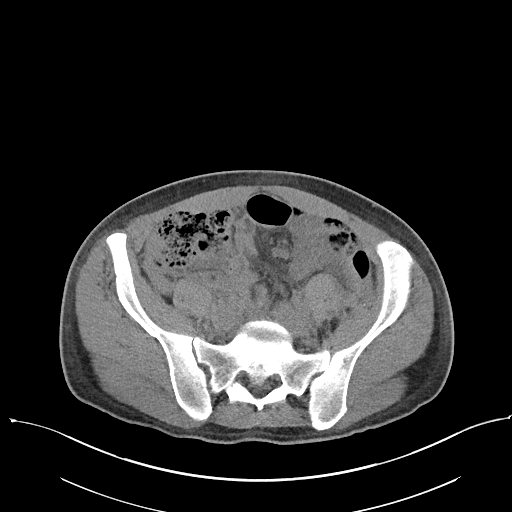
[im 59/106  soft-tissue]
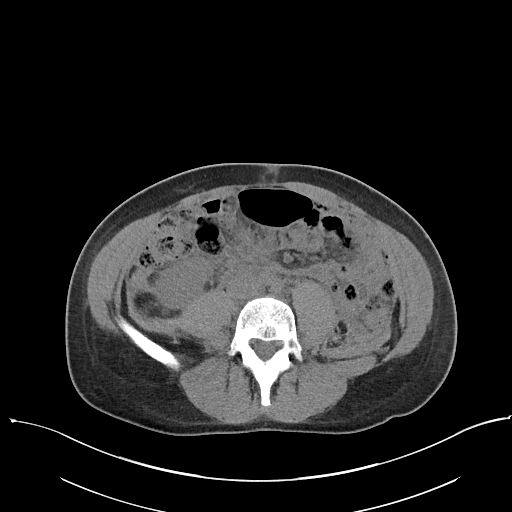
[im 68/106  soft-tissue]
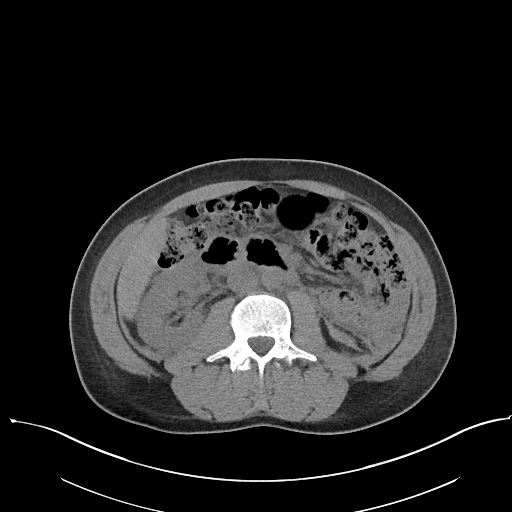
[im 80/106  soft-tissue]
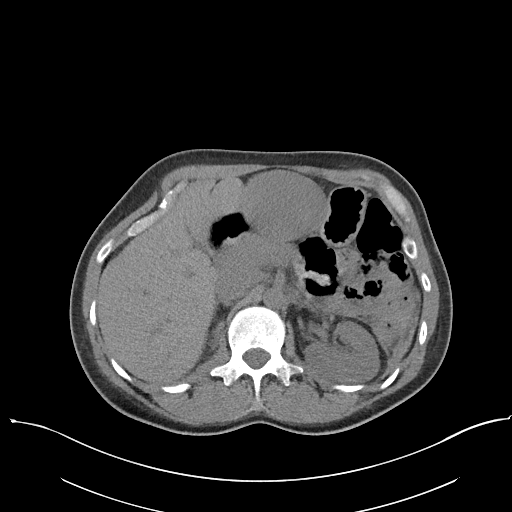
[im 89/106  soft-tissue]
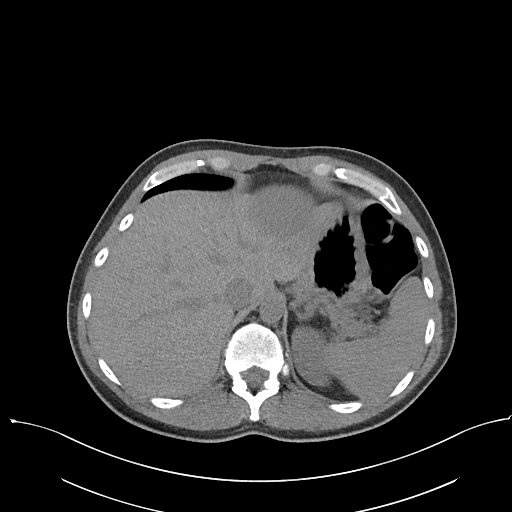
[im 89/106  bone]
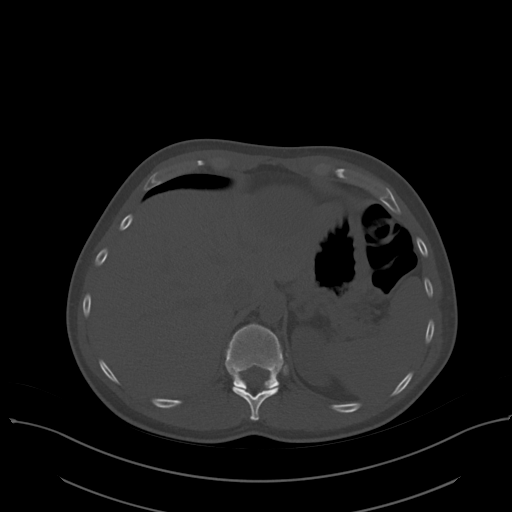
[im 97/106  soft-tissue]
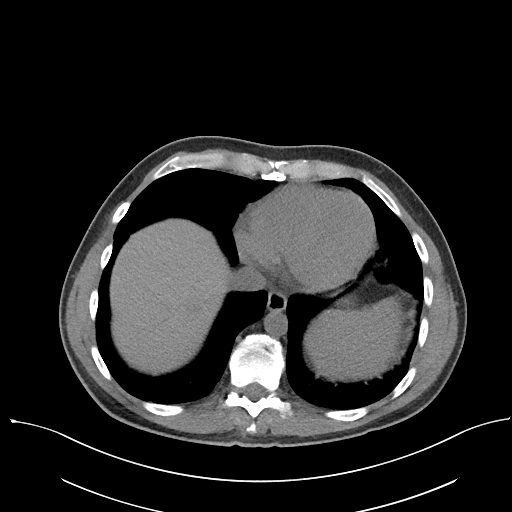

[Series 5: cor st · coronal · 0.77mm/px · 3 of 89 slices shown]
[im 30/89  soft-tissue]
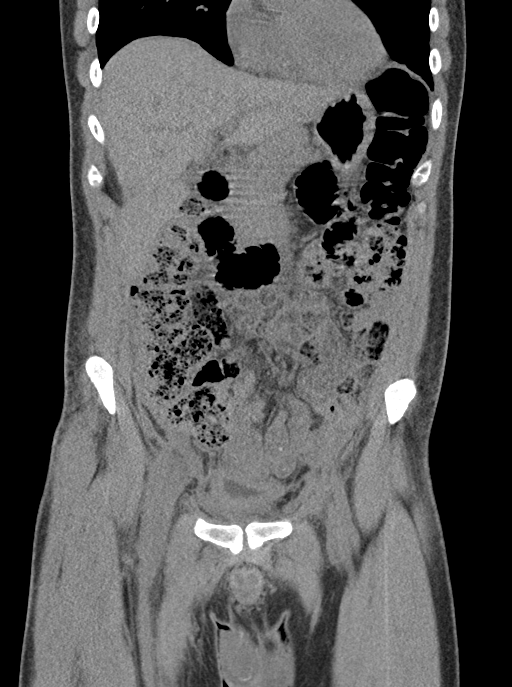
[im 40/89  soft-tissue]
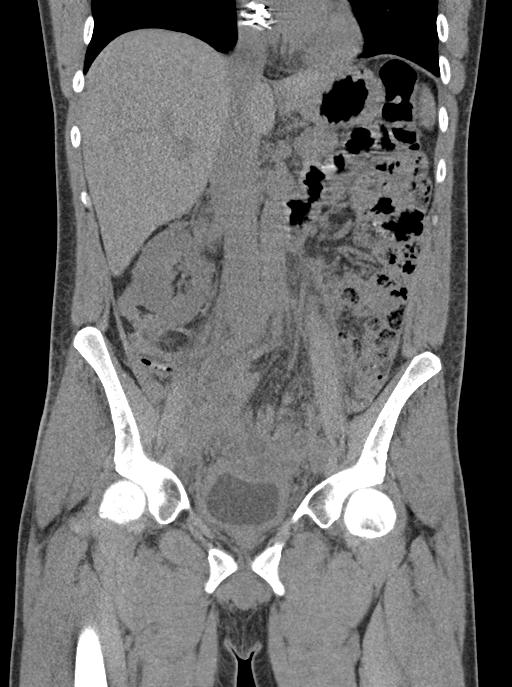
[im 49/89  soft-tissue]
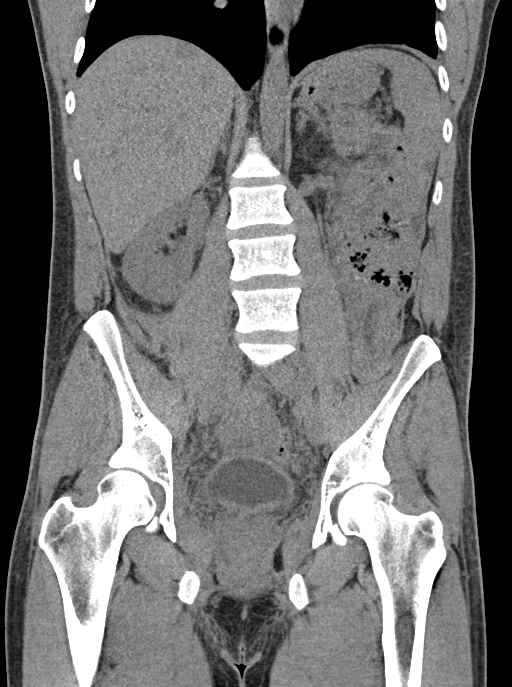

[13 of 46 positions shown; findings below may reference images not displayed]

FINDINGS: Lower chest: Clear lung bases. Normal heart size without pericardial
or pleural effusion. ASD defect repair.

Hepatobiliary: Either arising exophytic off for immediately adjacent
to the left lobe of the liver is a 7.9 x 6.3 cm soft tissue density
lesion including on [DATE]. No surrounding edema or fluid. Normal
gallbladder, without biliary ductal dilatation.

Pancreas: Normal, without mass or ductal dilatation.

Spleen: Normal in size, without focal abnormality.

Adrenals/Urinary Tract: Normal adrenal glands. No renal calculi or
hydronephrosis. No hydroureter or ureteric calculi. No bladder
calculi.

Stomach/Bowel: Normal stomach, without wall thickening. The
rectosigmoid junction is displaced to the left and appears
compressed by the soft tissue density detailed below. No colonic
obstruction. Normal terminal ileum and appendix. Normal small bowel
caliber.

Vascular/Lymphatic: Normal aortic caliber.  No abdominal adenopathy.

Reproductive: Normal prostate.  Tiny bilateral hydroceles.

Other: No free intraperitoneal air. Pelvic interstitial thickening
is relatively diffuse. There is thickening of both lower abdominal
lateral conal fascias, including on image 37/2. This continues in
the pelvis anterior to both iliacus muscles.

Right pelvic soft tissue fullness on the order of 6.8 cm on 67/2.

Tiny fat containing ventral abdominal wall hernia.

Musculoskeletal: No acute osseous abnormality.
IMPRESSION: 1. Diffuse lower abdominal and pelvic retroperitoneal soft tissue
thickening, suboptimally evaluated. Given the clinical history,
small volume hemorrhage is a concern.
2. Soft tissue fullness within the right hemipelvis, causing
apparent mass-effect upon the sigmoid colon. Although this could
simply represent non-opacified bowel loops on this noncontrast exam,
cannot exclude mass or hematoma in this region.
3. Exophytic indeterminate mass off of or adjacent to the left lobe
of the liver. Subacute hematoma (among many etiologies) could have
this appearance.
4. In order to further characterize the above findings, consider IV
contrast enhanced CT.

These results will be called to the ordering clinician or
representative by the Radiologist Assistant, and communication
documented in the PACS or [REDACTED].

ADDENDUM:
Case was discussed with Dr. Alt at [DATE] p.m. Of note, there is a
hepatic dome hypoattenuating 1.7 cm lesion on [DATE] not mentioned
above. This is greater than fluid density and technically
indeterminate. Dr. Alt plans follow-up contrast enhanced
abdominopelvic CT when patient has clinically improved from the
current acute episode.

*** End of Addendum ***
FINDINGS: Lower chest: Clear lung bases. Normal heart size without pericardial
or pleural effusion. ASD defect repair.

Hepatobiliary: Either arising exophytic off for immediately adjacent
to the left lobe of the liver is a 7.9 x 6.3 cm soft tissue density
lesion including on [DATE]. No surrounding edema or fluid. Normal
gallbladder, without biliary ductal dilatation.

Pancreas: Normal, without mass or ductal dilatation.

Spleen: Normal in size, without focal abnormality.

Adrenals/Urinary Tract: Normal adrenal glands. No renal calculi or
hydronephrosis. No hydroureter or ureteric calculi. No bladder
calculi.

Stomach/Bowel: Normal stomach, without wall thickening. The
rectosigmoid junction is displaced to the left and appears
compressed by the soft tissue density detailed below. No colonic
obstruction. Normal terminal ileum and appendix. Normal small bowel
caliber.

Vascular/Lymphatic: Normal aortic caliber.  No abdominal adenopathy.

Reproductive: Normal prostate.  Tiny bilateral hydroceles.

Other: No free intraperitoneal air. Pelvic interstitial thickening
is relatively diffuse. There is thickening of both lower abdominal
lateral conal fascias, including on image 37/2. This continues in
the pelvis anterior to both iliacus muscles.

Right pelvic soft tissue fullness on the order of 6.8 cm on 67/2.

Tiny fat containing ventral abdominal wall hernia.

Musculoskeletal: No acute osseous abnormality.
IMPRESSION: 1. Diffuse lower abdominal and pelvic retroperitoneal soft tissue
thickening, suboptimally evaluated. Given the clinical history,
small volume hemorrhage is a concern.
2. Soft tissue fullness within the right hemipelvis, causing
apparent mass-effect upon the sigmoid colon. Although this could
simply represent non-opacified bowel loops on this noncontrast exam,
cannot exclude mass or hematoma in this region.
3. Exophytic indeterminate mass off of or adjacent to the left lobe
of the liver. Subacute hematoma (among many etiologies) could have
this appearance.
4. In order to further characterize the above findings, consider IV
contrast enhanced CT.

These results will be called to the ordering clinician or
representative by the Radiologist Assistant, and communication
documented in the PACS or [REDACTED].

## 2021-07-20 ENCOUNTER — Other Ambulatory Visit (HOSPITAL_COMMUNITY): Payer: BC Managed Care – PPO

## 2021-07-22 ENCOUNTER — Other Ambulatory Visit (HOSPITAL_COMMUNITY): Payer: BC Managed Care – PPO

## 2021-07-22 ENCOUNTER — Ambulatory Visit: Payer: BC Managed Care – PPO | Admitting: Physician Assistant

## 2022-04-12 NOTE — Progress Notes (Unsigned)
[facility] [address] [place]. [place] [address] [phone]       OFFICE FOLLOW UP VISIT NOTE  Jordan Norton Date of Birth:  Jan 09, 1976 Medical Record Number:  [id number]   Referring MD: Jordan Matter, PA-C  Reason for Referral: Stroke and seizure   No chief complaint on file.    HPI:  Update 04/12/2022 Jordan Norton: Patient returns for 1 year stroke and seizure follow-up.  Overall stable since prior visit.  Denies any new stroke/TIA symptoms.  Residual right-sided visual loss stable, typically does not interfere with daily activity.  Denies any recent seizure activity, compliant on Keppra XR 500 mg daily, denies side effects.  Compliant on aspirin and atorvastatin.  Blood pressure today ***.  Had follow-up with cardiologist Dr. Burt Norton 05/2021 with 2D echo showing no residual right to left shunting and plans on follow-up next month.  Has follow-up with PCP next month.  No new concerns at this time.    History provided for reference purposes only Update 04/13/2021 Jordan Norton: Jordan Norton returns for 45-monthstroke and seizure follow-up unaccompanied.  Overall stable since prior visit.  Denies new stroke/TIA symptoms.  Residual right upper visual impairment - stable without worsening. Typically not noticeable deficit but at times can be noticeable such as when he is riding his mountain bike.  Compliant on aspirin and atorvastatin -denies associated side effects.  Blood pressure today 118/69.  TCD 10/2020 showed small residual right to left shunt post endovascular closure in 07/2020 but unlikely to be of any clinical significance.  Remains on Keppra XR without any recent seizure activity.  He has since weaned off from Dilantin without difficulty.  No new concerns at this time.  Update 10/14/2020 Jordan Norton He returns for follow-up after last visit 3 months ago.  He had lab work done at last visit which showed LDL cholesterol of 109 mg percent hemoglobin A1c of 4.9.  He has been started on  low-fat diet and plan is to repeat follow-up lipid profile today and start statin if LDL still suboptimal.  He transcranial Doppler bubble study done as an outpatient on 06/04/2020 which showed medium size right-to-left shunt.  This was subsequently confirmed on TEE on 07/01/2020 which also showed atrial septal aneurysm.  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  Patient apparently had no clinical symptomatology from this.  MRI of the brain and neck showed no significant large vessel stenosis or occlusion.  He was referred to Dr. CBurt Knackdid successful endovascular PFO closure on 07/16/2020.  Procedure was complicated by retroperitoneal hematoma which patient has improved.  He is on dual antiplatelet therapy aspirin Plavix and tolerating it well without bruising or bleeding.  States his blood pressure is under good control and today it is 130/68.  He has not had any seizures now for last 6 years but remains on Dilantin 300 mg extended release daily and Keppra Exar 500 mg daily.  Working full-time and has no complaints.  Inital visit 05/28/2020 Jordan Norton  Jordan Norton a 46year old Caucasian male seen today for consultation visit for new onset headache and vision difficulties.  History is obtained from the patient, review of electronic medical records and I personally reviewed available pertinent imaging films in PACS.  He has past medical history of hypertension, seizures since 2016 well-controlled on Dilantin and Keppra and history of asthma.  He states that to the end of July 2020 when he had an episode of severe bad headache  the morning he vomited twice and had to rest all day.  The headache he describes as moderate 7/10 in severity mostly aching but all over.  He denies any light or sound sensitivity.  There were no associated symptoms of blurred vision or focal neurological symptoms.  He rested the whole day.  Next day the headache was better and then gradually resolved.   He however started noticing visual flashes in the right upper quadrant of field of vision.  This lasted several days and subsequently has noticed some loss of vision in this area which has persisted.  He has not had any follow-up brain imaging done.  He denies any prior history of migraines but remembers one episode of somewhat similar headache he had in 2001 which was less severe and he threw up and that lasted a day but was not accompanied by visual symptoms.  He was able to work on that day and not have to lie down.  He does have family history of migraine is in his sister who has 1 or 2 such headaches a month and has some visual symptoms with it.  Patient noticed that after this current episode every time he had caffeine the visual flashes would be triggered so he cut back caffeine they stopped.  He has a prior history of 2 episodes of seizures in August 2016 which occurred back-to-back few days apart.  He may have had previous seizures in February, March and April that year which had not been recognized.  He had 1 seizure in 2004 and saw Dr. Jannifer Norton.  He was on Dilantin for a while which was stopped.  Dilantin was restarted following his seizures in 2016. MRI scan of the brain and EEG done in the office in September 2016 were both normal except for tiny to nonspecific white Norton hyperintensities of unclear significance.  He has also remained on Keppra XR '500mg'$  daily in addition to Dilantin ER 300 mg daily and tolerating both medications well without side effects.  His last Dilantin level on 10/01/2019 was 8.2.  CBC and BMP done on 04/01/2020 were both unremarkable.  He has no complaints today     ROS:   14 system review of systems is positive for those listed in HPI and all other systems negative  PMH:  Past Medical History:  Diagnosis Date   Asthma    Hypertension    Seizures ([place])     Social History:  Social History   Socioeconomic History   Marital status: Single    Spouse name: Not on  file   Number of children: Not on file   Years of education: 12   Highest education level: Not on file  Occupational History   Occupation: Silver Building services engineer   Tobacco Use   Smoking status: Never   Smokeless tobacco: Never  Substance and Sexual Activity   Alcohol use: No   Drug use: Yes    Types: Marijuana   Sexual activity: Never  Other Topics Concern   Not on file  Social History Narrative   Lives alone in a home    Drinks 1-3 cups caffeine daily   Right Handed   Social Determinants of Health   Financial Resource Strain: Not on file  Food Insecurity: Not on file  Transportation Needs: Not on file  Physical Activity: Not on file  Stress: Not on file  Social Connections: Not on file  Intimate Partner Violence: Not on file    Medications:   Current Outpatient  Medications on File Prior to Visit  Medication Sig Dispense Refill   albuterol (VENTOLIN HFA) 108 (90 Base) MCG/ACT inhaler Inhale 2 puffs into the lungs every 6 (six) hours as needed for wheezing or shortness of breath.      atorvastatin (LIPITOR) 40 MG tablet Take 1 tablet (40 mg total) by mouth daily. 90 tablet 3   fluticasone (FLONASE) 50 MCG/ACT nasal spray Place 1 spray into the nose daily.     fluticasone (FLOVENT HFA) 44 MCG/ACT inhaler Inhale 2 puffs into the lungs daily.      levETIRAcetam (KEPPRA XR) 500 MG 24 hr tablet Take 1 tablet (500 mg total) by mouth daily. 90 tablet 3   loratadine (CLARITIN) 10 MG tablet Take 10 mg by mouth daily.      losartan (COZAAR) 50 MG tablet Take 50 mg by mouth daily.     Multiple Vitamin (MULTIVITAMIN WITH MINERALS) TABS tablet Take 1 tablet by mouth daily.     pantoprazole (PROTONIX) 40 MG tablet Take 40 mg by mouth daily.  3   No current facility-administered medications on file prior to visit.    Allergies:   Allergies  Allergen Reactions   Eggs Or Egg-Derived Products     Asthma or hives or rash Cannot take Flu vaccine due to egg allergy    Hydrocodone-Acetaminophen Other (See Comments)    unknown   Levofloxacin Other (See Comments)    unknown   Penicillins Hives and Swelling    Childhood    Physical Exam There were no vitals filed for this visit.  There is no height or weight on file to calculate BMI.   General: well developed, well nourished middle-aged Caucasian male, seated, in no evident distress Head: head normocephalic and atraumatic.   Neck: supple with no carotid or supraclavicular bruits Cardiovascular: regular rate and rhythm, no murmurs Musculoskeletal: no deformity Skin:  no rash/petichiae Vascular:  Normal pulses all extremities  Neurologic Exam Mental Status: Awake and fully alert. Oriented to place and time. Recent and remote memory intact. Attention span, concentration and fund of knowledge appropriate. Mood and affect appropriate.  Cranial Nerves: Pupils equal, briskly reactive to light. Extraocular movements full without nystagmus. Visual fields full show right partial superior quadrantanopsia to confrontation. Hearing intact. Facial sensation intact. Face, tongue, palate moves normally and symmetrically.  Motor: Normal bulk and tone. Normal strength in all tested extremity muscles. Sensory.: intact to touch , pinprick , position and vibratory sensation.  Coordination: Rapid alternating movements normal in all extremities. Finger-to-nose and heel-to-shin performed accurately bilaterally. Gait and Station: Arises from chair without difficulty. Stance is normal. Gait demonstrates normal stride length and balance without use of assistive device. Able to heel, toe and tandem walk without difficulty.  Reflexes: 1+ and symmetric. Toes downgoing.        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 No significant vascular risk factors except borderline hyperlipidemia and PFO for which he has  undergone successful endovascular PFO closure on 07/16/2020.  Remote history of seizures 1st in 2004 and subsequent recurrence in 2016      PLAN:  1.  Left thalamic infarct -Continue aspirin 81 mg and atorvastatin 40 mg daily for secondary stroke prevention and  -Close PCP follow-up to maintain strict control of HLD with LDL cholesterol goal below 70 mg/dL.  -Lipid panel 10/2020 LDL 76 -atorvastatin refill provided  2.  PFO -s/p closure 07/16/2020  by Dr. Burt Norton -Repeat 2D echo 05/2021 no residual right to left shunting -Plan to follow-up in 1 year   3.  Seizure disorder -Continue Keppra XR 500 mg daily for seizure prophylaxis -refill provided -weaned off from Dilantin as instructed by Dr. Leonie Norton as stable without seizures on dual therapy for over 6 years    Follow-up in 1 year or call earlier if needed     CC:  [place] provider: Dr. Blima Dessert, Baldemar Friday., PA-C   I spent 33 minutes of face-to-face and non-face-to-face time with patient.  This included previsit chart review, lab review, study review, order entry, electronic health record documentation, patient education and discussion regarding history of stroke and residual deficits, secondary stroke prevention measures and aggressive stroke risk factor management, history of PFO s/p closure and further testing, hx of seizure disorder and use of AED and answered all other questions to patient satisfaction  Frann Rider, AGNP-BC  [facility] [address] [place], [place] [address]  Phone [phone] Fax [phone] Note: This document was prepared with digital dictation and possible smart phrase technology. Any transcriptional errors that result from this process are unintentional.

## 2022-04-13 ENCOUNTER — Ambulatory Visit: Payer: BC Managed Care – PPO | Admitting: Adult Health

## 2022-04-13 ENCOUNTER — Encounter: Payer: Self-pay | Admitting: Adult Health

## 2022-04-13 VITALS — BP 112/70 | HR 51 | Ht 70.0 in | Wt 177.0 lb

## 2022-04-13 DIAGNOSIS — I6381 Other cerebral infarction due to occlusion or stenosis of small artery: Secondary | ICD-10-CM | POA: Diagnosis not present

## 2022-04-13 DIAGNOSIS — G40209 Localization-related (focal) (partial) symptomatic epilepsy and epileptic syndromes with complex partial seizures, not intractable, without status epilepticus: Secondary | ICD-10-CM | POA: Diagnosis not present

## 2022-04-13 DIAGNOSIS — Q2112 Patent foramen ovale: Secondary | ICD-10-CM | POA: Diagnosis not present

## 2022-04-13 MED ORDER — LEVETIRACETAM ER 500 MG PO TB24: 500.0000 mg | ORAL_TABLET | Freq: Every day | ORAL | 3 refills | Status: DC

## 2022-04-13 NOTE — Patient Instructions (Signed)
It was great to see you again today!   No changes at this time, continue current treatment plan   Follow-up in 1 year or call earlier if needed

## 2022-04-27 ENCOUNTER — Other Ambulatory Visit: Payer: Self-pay | Admitting: Adult Health

## 2022-05-31 ENCOUNTER — Other Ambulatory Visit: Payer: Self-pay | Admitting: Family Medicine

## 2022-05-31 DIAGNOSIS — D1803 Hemangioma of intra-abdominal structures: Secondary | ICD-10-CM

## 2022-06-01 ENCOUNTER — Other Ambulatory Visit: Payer: Self-pay | Admitting: Family Medicine

## 2022-06-01 DIAGNOSIS — N50812 Left testicular pain: Secondary | ICD-10-CM

## 2022-06-14 ENCOUNTER — Ambulatory Visit
Admission: RE | Admit: 2022-06-14 | Discharge: 2022-06-14 | Disposition: A | Discharge status: Home / Self Care | Payer: BC Managed Care – PPO | Source: Ambulatory Visit | Attending: Family Medicine | Admitting: Family Medicine

## 2022-06-14 DIAGNOSIS — D1803 Hemangioma of intra-abdominal structures: Secondary | ICD-10-CM

## 2022-06-14 DIAGNOSIS — N50812 Left testicular pain: Secondary | ICD-10-CM

## 2022-06-24 LAB — COLOGUARD: COLOGUARD: NEGATIVE

## 2022-09-28 ENCOUNTER — Ambulatory Visit: Payer: BC Managed Care – PPO | Attending: Cardiovascular Disease | Admitting: Cardiovascular Disease

## 2022-09-28 ENCOUNTER — Encounter: Payer: Self-pay | Admitting: Cardiovascular Disease

## 2022-09-28 VITALS — BP 116/82 | HR 58 | Ht 70.0 in | Wt 176.4 lb

## 2022-09-28 DIAGNOSIS — Q2112 Patent foramen ovale: Secondary | ICD-10-CM

## 2022-09-28 DIAGNOSIS — E782 Mixed hyperlipidemia: Secondary | ICD-10-CM

## 2022-09-28 DIAGNOSIS — I10 Essential (primary) hypertension: Secondary | ICD-10-CM | POA: Diagnosis not present

## 2022-09-28 NOTE — Patient Instructions (Signed)
Medication Instructions:  Your physician recommends that you continue on your current medications as directed. Please refer to the Current Medication list given to you today.  *If you need a refill on your cardiac medications before your next appointment, please call your pharmacy*   Lab Work: NONE If you have labs (blood work) drawn today and your tests are completely normal, you will receive your results only by: [place] (if you have MyChart) OR A paper copy in the mail If you have any lab test that is abnormal or we need to change your treatment, we will call you to review the results.   Testing/Procedures: NONE   Follow-Up: At [facility], you and your health needs are our priority.  As part of our continuing mission to provide you with exceptional heart care, we have created designated Provider Care Teams.  These Care Teams include your primary Cardiologist (physician) and Advanced Practice Providers (APPs -  Physician Assistants and Nurse Practitioners) who all work together to provide you with the care you need, when you need it.  We recommend signing up for the patient portal called "MyChart".  Sign up information is provided on this After Visit Summary.  MyChart is used to connect with patients for Virtual Visits (Telemedicine).  Patients are able to view lab/test results, encounter notes, upcoming appointments, etc.  Non-urgent messages can be sent to your provider as well.   To learn more about what you can do with MyChart, go to [email or web address].    Your next appointment:   2 year(s)  Provider:   Sherren Mocha, MD

## 2022-09-28 NOTE — Progress Notes (Signed)
Cardiology Office Note:    Date:  10/01/2022   ID:  Jordan Norton, DOB 1976-06-21, MRN [id number]  PCP:  Aletha Halim PA-C   [facility] Providers Cardiologist:  Sherren Mocha, MD     Referring MD: Aletha Halim., PA-C   Chief Complaint  Patient presents with   Follow-up    PFO    History of Present Illness:    Jordan Norton is a 47 y.o. male with a hx of cryptogenic stroke and PFO who underwent transcatheter PFO closure in 2021.  The patient had a follow-up echocardiogram that demonstrated complete closure of his PFO with no residual right to left shunt by color or bubble study.  He presents today for follow-up evaluation.  The patient is here alone today.  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  His boss just retired a few months ago and he has taken over the business.  He denies any chest pain, chest pressure, or heart palpitations.  He remains on low-dose aspirin for antiplatelet therapy.  Past Medical History:  Diagnosis Date   Asthma    Hypertension    Seizures ([place])     Past Surgical History:  Procedure Laterality Date   BUBBLE STUDY  07/01/2020   Procedure: BUBBLE STUDY;  Surgeon: Donato Heinz, MD;  Location: [place];  Service: Cardiovascular;;   DENTAL SURGERY     PATENT FORAMEN OVALE(PFO) CLOSURE N/A 07/16/2020   Procedure: PATENT FORAMEN OVALE (PFO) CLOSURE;  Surgeon: Sherren Mocha, MD;  Location: [facility];  Service: Cardiovascular;  Laterality: N/A;   TEE WITHOUT CARDIOVERSION N/A 07/01/2020   Procedure: TRANSESOPHAGEAL ECHOCARDIOGRAM (TEE);  Surgeon: Donato Heinz, MD;  [facility] END[facility];  Service: Cardiovascular;  Laterality: N/A;    Current Medications: Current Meds  Medication Sig   albuterol (VENTOLIN HFA) 108 (90 Base) MCG/ACT inhaler Inhale 2 puffs into the lungs every 6 (six) hours as needed for wheezing or shortness of breath.    aspirin EC 81 MG tablet Take 81 mg by mouth  daily. Swallow whole.   atorvastatin (LIPITOR) 40 MG tablet TAKE ONE TABLET ONCE DAILY   fluticasone (FLONASE) 50 MCG/ACT nasal spray Place 1 spray into the nose daily.   fluticasone (FLOVENT HFA) 44 MCG/ACT inhaler Inhale 2 puffs into the lungs daily.    levETIRAcetam (KEPPRA XR) 500 MG 24 hr tablet Take 1 tablet (500 mg total) by mouth daily.   loratadine (CLARITIN) 10 MG tablet Take 10 mg by mouth daily.    losartan (COZAAR) 50 MG tablet Take 50 mg by mouth daily.   Multiple Vitamin (MULTIVITAMIN WITH MINERALS) TABS tablet Take 1 tablet by mouth daily.   pantoprazole (PROTONIX) 40 MG tablet Take 40 mg by mouth daily.     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 [employer]   Tobacco Use   Smoking status: Never   Smokeless tobacco: Never  Substance and Sexual Activity   Alcohol use: No   Drug use: Yes    Types: Marijuana   Sexual activity: Never  Other Topics Concern   Not on file  Social History Narrative   Lives alone in  a home    Drinks 1-3 cups caffeine daily   Right Handed   Social Determinants of Health   Financial Resource Strain: Not on file  Food Insecurity: Not on file  Transportation Needs: Not on file  Physical Activity: Not on file  Stress: Not on file  Social Connections: Not on file     Family History: The patient's family history includes Cancer in his maternal uncle and mother; Diabetes in his paternal grandmother; Heart disease in his maternal grandfather; Hypertension in his father; Parkinsonism in his maternal grandmother and mother.  ROS:   Please see the history of present illness.    All other systems reviewed and are  negative.  EKGs/Labs/Other Studies Reviewed:    The following studies were reviewed today: 2D echocardiogram 05/18/2021:  1. Left ventricular ejection fraction, by estimation, is 55 to 60%. The  left ventricle has normal function. The left ventricle has no regional  wall motion abnormalities. Left ventricular diastolic parameters were  normal.   2. Right ventricular systolic function is normal. The right ventricular  size is normal. Tricuspid regurgitation signal is inadequate for assessing  PA pressure.   3. The mitral valve is normal in structure. Trivial mitral valve  regurgitation. No evidence of mitral stenosis.   4. The aortic valve is tricuspid. Aortic valve regurgitation is not  visualized. No aortic stenosis is present.   5. The inferior vena cava is normal in size with greater than 50%  respiratory variability, suggesting right atrial pressure of 3 mmHg.   6. PFO closure device visualized, no residual shunt noted by color  doppler or bubble study.   EKG:  EKG is ordered today.  The ekg ordered today demonstrates sinus bradycardia 58 bpm, otherwise within normal limits  Recent Labs: No results found for requested labs within last 365 days.  Recent Lipid Panel    Component Value Date/Time   CHOL 163 10/14/2020 1358   TRIG 120 10/14/2020 1358   HDL 66 10/14/2020 1358   CHOLHDL 2.5 10/14/2020 1358   LDLCALC 76 10/14/2020 1358     Risk Assessment/Calculations:            Physical Exam:    VS:  BP 116/82   Pulse (!) 58   Ht '5\' 10"'$  (1.778 m)   Wt 176 lb 6.4 oz (80 kg)   SpO2 98%   BMI 25.31 kg/m     Wt Readings from Last 3 Encounters:  09/28/22 176 lb 6.4 oz (80 kg)  04/13/22 177 lb (80.3 kg)  05/18/21 179 lb (81.2 kg)     GEN:  Well nourished, well developed in no acute distress HEENT: Normal NECK: No JVD; No carotid bruits LYMPHATICS: No lymphadenopathy CARDIAC: RRR, no murmurs, rubs, gallops RESPIRATORY:  Clear to auscultation without rales,  wheezing or rhonchi  ABDOMEN: Soft, non-tender, non-distended MUSCULOSKELETAL:  No edema; No deformity  SKIN: Warm and dry NEUROLOGIC:  Alert and oriented x 3 PSYCHIATRIC:  Normal affect   ASSESSMENT:    1. PFO (patent foramen ovale)   2. Mixed hyperlipidemia   3. Essential hypertension    PLAN:    In order of problems listed above:  The patient is doing well now 2 years out from PFO closure.  He has had no recurrent neurologic events.  He has undergone follow-up echo bubble assessment demonstrating complete closure with no residual shunt.  He remains on aspirin for antiplatelet therapy and a high intensity statin drug.  I will see him back  in 2 years for follow-up evaluation. Lipids are excellent on atorvastatin.  Labs were drawn in September 2023 demonstrating an LDL cholesterol of 51 mg/dL. Blood pressure well-controlled on losartan.           Medication Adjustments/Labs and Tests Ordered: Current medicines are reviewed at length with the patient today.  Concerns regarding medicines are outlined above.  Orders Placed This Encounter  Procedures   EKG 12-Lead   No orders of the defined types were placed in this encounter.   Patient Instructions  Medication Instructions:  Your physician recommends that you continue on your current medications as directed. Please refer to the Current Medication list given to you today.  *If you need a refill on your cardiac medications before your next appointment, please call your pharmacy*   Lab Work: NONE If you have labs (blood work) drawn today and your tests are completely normal, you will receive your results only by: [place] (if you have MyChart) OR A paper copy in the mail If you have any lab test that is abnormal or we need to change your treatment, we will call you to review the results.   Testing/Procedures: NONE   Follow-Up: At [facility], you and your health needs are our priority.  As part of our  continuing mission to provide you with exceptional heart care, we have created designated Provider Care Teams.  These Care Teams include your primary Cardiologist (physician) and Advanced Practice Providers (APPs -  Physician Assistants and Nurse Practitioners) who all work together to provide you with the care you need, when you need it.  We recommend signing up for the patient portal called "MyChart".  Sign up information is provided on this After Visit Summary.  MyChart is used to connect with patients for Virtual Visits (Telemedicine).  Patients are able to view lab/test results, encounter notes, upcoming appointments, etc.  Non-urgent messages can be sent to your provider as well.   To learn more about what you can do with MyChart, go to [email or web address].    Your next appointment:   2 year(s)  Provider:   Sherren Mocha, MD        Signed, Sherren Mocha, MD  10/01/2022 9:15 PM    [place]

## 2023-04-14 ENCOUNTER — Ambulatory Visit: Payer: BC Managed Care – PPO | Admitting: Adult Health

## 2023-04-14 NOTE — Progress Notes (Deleted)
[facility] [address] [place]. [place] [address] [phone]       OFFICE FOLLOW UP VISIT NOTE  Jordan Norton Date of Birth:  1975-10-24 Medical Record Number:  [id number]   Referring MD: Mady Gemma, PA-C  Reason for Referral: Stroke and seizure   No chief complaint on file.    HPI:  Update 04/14/2023 JM: Patient returns for yearly stroke and seizure follow-up.  Stable since prior prior visit.  Denies new stroke/TIA symptoms, continued right sided vision loss, stable.  Denies any seizure activity on Keppra XR 500mg  daily.  Compliant on medications.  Routinely follows with PCP for stroke risk factor management.  Continues to be followed by cardiology Dr. Excell Seltzer.     History provided for reference purposes only Update 04/12/2022 JM: Patient returns for 1 year stroke and seizure follow-up.  Overall stable since prior visit.  Denies any new stroke/TIA symptoms.  Residual right-sided visual loss stable, typically does not interfere with daily activity.  Denies any recent seizure activity, compliant on Keppra XR 500 mg daily, denies side effects.  Compliant on aspirin and atorvastatin.  Blood pressure today 112/70.  Had follow-up with cardiologist Dr. Excell Seltzer 05/2021 with 2D echo showing no residual right to left shunting and plans on follow-up next month.  Has follow-up with PCP next month.  No new concerns at this time.  Update 04/13/2021 JM: Jordan Norton returns for 46-month stroke and seizure follow-up unaccompanied.  Overall stable since prior visit.  Denies new stroke/TIA symptoms.  Residual right upper visual impairment - stable without worsening. Typically not noticeable deficit but at times can be noticeable such as when he is riding his mountain bike.  Compliant on aspirin and atorvastatin -denies associated side effects.  Blood pressure today 118/69.  TCD 10/2020 showed small residual right to left shunt post endovascular closure in 07/2020 but unlikely to be of any  clinical significance.  Remains on Keppra XR without any recent seizure activity.  He has since weaned off from Dilantin without difficulty.  No new concerns at this time.  Update 10/14/2020 Dr. Pearlean Brownie: He returns for follow-up after last visit 3 months ago.  He had lab work done at last visit which showed LDL cholesterol of 109 mg percent hemoglobin A1c of 4.9.  He has been started on low-fat diet and plan is to repeat follow-up lipid profile today and start statin if LDL still suboptimal.  He transcranial Doppler bubble study done as an outpatient on 06/04/2020 which showed medium size right-to-left shunt.  This was subsequently confirmed on TEE on 07/01/2020 which also showed atrial septal aneurysm.  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  Patient apparently had no clinical symptomatology from this.  MRI of the brain and neck showed no significant large vessel stenosis or occlusion.  He was referred to Dr. Excell Seltzer did successful endovascular PFO closure on 07/16/2020.  Procedure was complicated by retroperitoneal hematoma which patient has improved.  He is on dual antiplatelet therapy aspirin Plavix and tolerating it well without bruising or bleeding.  States his blood pressure is under good control and today it is 130/68.  He has not had any seizures now for last 6 years but remains on Dilantin 300 mg extended release daily and Keppra Exar 500 mg daily.  Working full-time and has no complaints.  Inital visit 05/28/2020 Dr. Pearlean Brownie:  Jordan Norton is a 47 year old Caucasian male seen today for consultation visit for new onset headache and  vision difficulties.  History is obtained from the patient, review of electronic medical records and I personally reviewed available pertinent imaging films in PACS.  He has past medical history of hypertension, seizures since 2016 well-controlled on Dilantin and Keppra and history of asthma.  He states that to the end of July 2020 when he had  an episode of severe bad headache the morning he vomited twice and had to rest all day.  The headache he describes as moderate 7/10 in severity mostly aching but all over.  He denies any light or sound sensitivity.  There were no associated symptoms of blurred vision or focal neurological symptoms.  He rested the whole day.  Next day the headache was better and then gradually resolved.  He however started noticing visual flashes in the right upper quadrant of field of vision.  This lasted several days and subsequently has noticed some loss of vision in this area which has persisted.  He has not had any follow-up brain imaging done.  He denies any prior history of migraines but remembers one episode of somewhat similar headache he had in 2001 which was less severe and he threw up and that lasted a day but was not accompanied by visual symptoms.  He was able to work on that day and not have to lie down.  He does have family history of migraine is in his sister who has 1 or 2 such headaches a month and has some visual symptoms with it.  Patient noticed that after this current episode every time he had caffeine the visual flashes would be triggered so he cut back caffeine they stopped.  He has a prior history of 2 episodes of seizures in August 2016 which occurred back-to-back few days apart.  He may have had previous seizures in February, March and April that year which had not been recognized.  He had 1 seizure in 2004 and saw Dr. Anne Hahn.  He was on Dilantin for a while which was stopped.  Dilantin was restarted following his seizures in 2016. MRI scan of the brain and EEG done in the office in September 2016 were both normal except for tiny to nonspecific white matter hyperintensities of unclear significance.  He has also remained on Keppra XR 500mg  daily in addition to Dilantin ER 300 mg daily and tolerating both medications well without side effects.  His last Dilantin level on 10/01/2019 was 8.2.  CBC and BMP done  on 04/01/2020 were both unremarkable.  He has no complaints today     ROS:   14 system review of systems is positive for those listed in HPI and all other systems negative  PMH:  Past Medical History:  Diagnosis Date   Asthma    Hypertension    Seizures (HCC)     Social History:  Social History   Socioeconomic History   Marital status: Single    Spouse name: Not on file   Number of children: Not on file   Years of education: 12   Highest education level: Not on file  Occupational History   Occupation: Silver Therapist, occupational   Tobacco Use   Smoking status: Never   Smokeless tobacco: Never  Substance and Sexual Activity   Alcohol use: No   Drug use: Yes    Types: Marijuana   Sexual activity: Never  Other Topics Concern   Not on file  Social History Narrative   Lives alone in a home    Drinks 1-3 cups caffeine  daily   Right Handed   Social Determinants of Health   Financial Resource Strain: Not on file  Food Insecurity: Not on file  Transportation Needs: Not on file  Physical Activity: Not on file  Stress: Not on file  Social Connections: Not on file  Intimate Partner Violence: Not on file    Medications:   Current Outpatient Medications on File Prior to Visit  Medication Sig Dispense Refill   albuterol (VENTOLIN HFA) 108 (90 Base) MCG/ACT inhaler Inhale 2 puffs into the lungs every 6 (six) hours as needed for wheezing or shortness of breath.      aspirin EC 81 MG tablet Take 81 mg by mouth daily. Swallow whole.     atorvastatin (LIPITOR) 40 MG tablet TAKE ONE TABLET ONCE DAILY 90 tablet 3   fluticasone (FLONASE) 50 MCG/ACT nasal spray Place 1 spray into the nose daily.     fluticasone (FLOVENT HFA) 44 MCG/ACT inhaler Inhale 2 puffs into the lungs daily.      levETIRAcetam (KEPPRA XR) 500 MG 24 hr tablet Take 1 tablet (500 mg total) by mouth daily. 90 tablet 3   loratadine (CLARITIN) 10 MG tablet Take 10 mg by mouth daily.      losartan (COZAAR) 50 MG  tablet Take 50 mg by mouth daily.     Multiple Vitamin (MULTIVITAMIN WITH MINERALS) TABS tablet Take 1 tablet by mouth daily.     pantoprazole (PROTONIX) 40 MG tablet Take 40 mg by mouth daily.  3   No current facility-administered medications on file prior to visit.    Allergies:   Allergies  Allergen Reactions   Egg-Derived Products     Asthma or hives or rash Cannot take Flu vaccine due to egg allergy   Hydrocodone-Acetaminophen Other (See Comments)    unknown   Levofloxacin Other (See Comments)    unknown   Penicillins Hives and Swelling    Childhood    Physical Exam There were no vitals filed for this visit.  There is no height or weight on file to calculate BMI.    General: well developed, well nourished middle-aged Caucasian male, seated, in no evident distress Head: head normocephalic and atraumatic.   Neck: supple with no carotid or supraclavicular bruits Cardiovascular: regular rate and rhythm, no murmurs Musculoskeletal: no deformity Skin:  no rash/petichiae Vascular:  Normal pulses all extremities  Neurologic Exam Mental Status: Awake and fully alert. Oriented to place and time. Recent and remote memory intact. Attention span, concentration and fund of knowledge appropriate. Mood and affect appropriate.  Cranial Nerves: Pupils equal, briskly reactive to light. Extraocular movements full without nystagmus. Visual fields full show right partial superior quadrantanopsia to confrontation. Hearing intact. Facial sensation intact. Face, tongue, palate moves normally and symmetrically.  Motor: Normal bulk and tone. Normal strength in all tested extremity muscles. Sensory.: intact to touch , pinprick , position and vibratory sensation.  Coordination: Rapid alternating movements normal in all extremities. Finger-to-nose and heel-to-shin performed accurately bilaterally. Gait and Station: Arises from chair without difficulty. Stance is normal. Gait demonstrates normal  stride length and balance without use of assistive device. Able to heel, toe and tandem walk without difficulty.  Reflexes: 1+ and symmetric. Toes downgoing.        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 No significant vascular risk factors except borderline hyperlipidemia and PFO for which he has undergone successful endovascular PFO  closure on 07/16/2020.  Remote history of seizures 1st in 2004 and subsequent recurrence in 2016      PLAN:  1.  Left thalamic infarct -Continue aspirin 81 mg and atorvastatin 40 mg daily for secondary stroke prevention and  -Close PCP follow-up to maintain strict control of HLD with LDL cholesterol goal below 70 mg/dL.  -Lipid panel 10/2020 LDL 76 -atorvastatin refill provided   2.  PFO -s/p closure 07/16/2020 by Dr. Excell Seltzer -Repeat 2D echo 05/2021 no residual right to left shunting -Plans to follow-up with Dr. Excell Seltzer in 09/2024   3.  Seizure disorder -Continue Keppra XR 500 mg daily for seizure prophylaxis -refill provided -weaned off from Dilantin as instructed by Dr. Pearlean Brownie as stable without seizures on dual therapy for over 6 years    Follow-up in 1 year or call earlier if needed     CC:  Arlyce Dice, Kristen W., PA-C   I spent 24 minutes of face-to-face and non-face-to-face time with patient.  This included previsit chart review, lab review, study review, order entry, electronic health record documentation, patient education and discussion regarding history of stroke and residual deficits, secondary stroke prevention measures and aggressive stroke risk factor management, history of PFO s/p closure and further testing, hx of seizure disorder and use of AED and answered all other questions to patient satisfaction  Ihor Austin, AGNP-BC  [facility] [address] [place], [place]  [address]  Phone [phone] Fax [phone] Note: This document was prepared with digital dictation and possible smart phrase technology. Any transcriptional errors that result from this process are unintentional.

## 2023-04-26 ENCOUNTER — Other Ambulatory Visit: Payer: Self-pay | Admitting: Adult Health

## 2023-05-05 NOTE — Progress Notes (Signed)
[facility] [address] [place]. [place] [address] [phone]       OFFICE FOLLOW UP VISIT NOTE  [name] Date of Birth:  [birth year] Medical Record Number:  [id number]   Referring MD: Mady Gemma, PA-C  Reason for Referral: Stroke and seizure   Chief Complaint  Patient presents with   Follow-up    Patient in room #3 and alone. Patient states he been well and stable, no new concerns.     HPI:  Update 05/10/2023 JM: Patient returns for yearly stroke and seizure follow-up.  Stable since prior prior visit.  Denies new stroke/TIA symptoms, continued right sided vision loss, stable.  Denies any seizure activity on Keppra XR 500mg  daily.  Compliant on medications.  Routinely follows with PCP for stroke risk factor management has f/u visit end of this month with plans on repeat lab work.  Continues to be followed by cardiology Dr. Excell Seltzer.     History provided for reference purposes only Update 04/12/2022 JM: Patient returns for 1 year stroke and seizure follow-up.  Overall stable since prior visit.  Denies any new stroke/TIA symptoms.  Residual right-sided visual loss stable, typically does not interfere with daily activity.  Denies any recent seizure activity, compliant on Keppra XR 500 mg daily, denies side effects.  Compliant on aspirin and atorvastatin.  Blood pressure today 112/70.  Had follow-up with cardiologist Dr. Excell Seltzer 05/2021 with 2D echo showing no residual right to left shunting and plans on follow-up next month.  Has follow-up with PCP next month.  No new concerns at this time.  Update 04/13/2021 JM: [name] returns for 31-month stroke and seizure follow-up unaccompanied.  Overall stable since prior visit.  Denies new stroke/TIA symptoms.  Residual right upper visual impairment - stable without worsening. Typically not noticeable deficit but at times can be noticeable such as when he is riding his mountain bike.  Compliant on aspirin and atorvastatin  -denies associated side effects.  Blood pressure today 118/69.  TCD 10/2020 showed small residual right to left shunt post endovascular closure in 07/2020 but unlikely to be of any clinical significance.  Remains on Keppra XR without any recent seizure activity.  He has since weaned off from Dilantin without difficulty.  No new concerns at this time.  Update 10/14/2020 Dr. Pearlean Brownie: He returns for follow-up after last visit 3 months ago.  He had lab work done at last visit which showed LDL cholesterol of 109 mg percent hemoglobin A1c of 4.9.  He has been started on low-fat diet and plan is to repeat follow-up lipid profile today and start statin if LDL still suboptimal.  He transcranial Doppler bubble study done as an outpatient on 06/04/2020 which showed medium size right-to-left shunt.  This was subsequently confirmed on TEE on 07/01/2020 which also showed atrial septal aneurysm.  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  Patient apparently had no clinical symptomatology from this.  MRI of the brain and neck showed no significant large vessel stenosis or occlusion.  He was referred to Dr. Excell Seltzer did successful endovascular PFO closure on 07/16/2020.  Procedure was complicated by retroperitoneal hematoma which patient has improved.  He is on dual antiplatelet therapy aspirin Plavix and tolerating it well without bruising or bleeding.  States his blood pressure is under good control and today it is 130/68.  He has not had any seizures now for last 6 years but remains on Dilantin 300 mg extended release daily  and Keppra Exar 500 mg daily.  Working full-time and has no complaints.  Inital visit 05/28/2020 Dr. Pearlean Brownie:  [name] is a 47 year old Caucasian male seen today for consultation visit for new onset headache and vision difficulties.  History is obtained from the patient, review of electronic medical records and I personally reviewed available pertinent imaging films in PACS.   He has past medical history of hypertension, seizures since 2016 well-controlled on Dilantin and Keppra and history of asthma.  He states that to the end of July 2020 when he had an episode of severe bad headache the morning he vomited twice and had to rest all day.  The headache he describes as moderate 7/10 in severity mostly aching but all over.  He denies any light or sound sensitivity.  There were no associated symptoms of blurred vision or focal neurological symptoms.  He rested the whole day.  Next day the headache was better and then gradually resolved.  He however started noticing visual flashes in the right upper quadrant of field of vision.  This lasted several days and subsequently has noticed some loss of vision in this area which has persisted.  He has not had any follow-up brain imaging done.  He denies any prior history of migraines but remembers one episode of somewhat similar headache he had in 2001 which was less severe and he threw up and that lasted a day but was not accompanied by visual symptoms.  He was able to work on that day and not have to lie down.  He does have family history of migraine is in his sister who has 1 or 2 such headaches a month and has some visual symptoms with it.  Patient noticed that after this current episode every time he had caffeine the visual flashes would be triggered so he cut back caffeine they stopped.  He has a prior history of 2 episodes of seizures in August 2016 which occurred back-to-back few days apart.  He may have had previous seizures in February, March and April that year which had not been recognized.  He had 1 seizure in 2004 and saw Dr. Anne Hahn.  He was on Dilantin for a while which was stopped.  Dilantin was restarted following his seizures in 2016. MRI scan of the brain and EEG done in the office in September 2016 were both normal except for tiny to nonspecific white matter hyperintensities of unclear significance.  He has also remained on Keppra  XR 500mg  daily in addition to Dilantin ER 300 mg daily and tolerating both medications well without side effects.  His last Dilantin level on 10/01/2019 was 8.2.  CBC and BMP done on 04/01/2020 were both unremarkable.  He has no complaints today     ROS:   14 system review of systems is positive for those listed in HPI and all other systems negative  PMH:  Past Medical History:  Diagnosis Date   Asthma    Hypertension    Seizures (HCC)     Social History:  Social History   Socioeconomic History   Marital status: Single    Spouse name: Not on file   Number of children: Not on file   Years of education: 12   Highest education level: Not on file  Occupational History   Occupation: Silver Chartered loss adjuster [employer]   Tobacco Use   Smoking status: Never   Smokeless tobacco: Never  Substance and Sexual Activity   Alcohol use: No   Drug use: Yes  Types: Marijuana   Sexual activity: Never  Other Topics Concern   Not on file  Social History Narrative   Lives alone in a home    Drinks 1-3 cups caffeine daily   Right Handed   Social Determinants of Health   Financial Resource Strain: Not on file  Food Insecurity: Not on file  Transportation Needs: Not on file  Physical Activity: Not on file  Stress: Not on file  Social Connections: Not on file  Intimate Partner Violence: Not on file    Medications:   Current Outpatient Medications on File Prior to Visit  Medication Sig Dispense Refill   albuterol (VENTOLIN HFA) 108 (90 Base) MCG/ACT inhaler Inhale 2 puffs into the lungs every 6 (six) hours as needed for wheezing or shortness of breath.      aspirin EC 81 MG tablet Take 81 mg by mouth daily. Swallow whole.     atorvastatin (LIPITOR) 40 MG tablet TAKE ONE TABLET ONCE DAILY 90 tablet 0   fluticasone (FLONASE) 50 MCG/ACT nasal spray Place 1 spray into the nose daily.     fluticasone (FLOVENT HFA) 44 MCG/ACT inhaler Inhale 2 puffs into the lungs daily.      levETIRAcetam (KEPPRA  XR) 500 MG 24 hr tablet Take 1 tablet (500 mg total) by mouth daily. 90 tablet 3   loratadine (CLARITIN) 10 MG tablet Take 10 mg by mouth daily.      losartan (COZAAR) 50 MG tablet Take 50 mg by mouth daily.     Multiple Vitamin (MULTIVITAMIN WITH MINERALS) TABS tablet Take 1 tablet by mouth daily.     pantoprazole (PROTONIX) 40 MG tablet Take 40 mg by mouth daily.  3   Pediatric Multiple Vitamins (FLINTSTONES PLUS EXTRA C) CHEW Chew 1 tablet by mouth daily.     No current facility-administered medications on file prior to visit.    Allergies:   Allergies  Allergen Reactions   Egg-Derived Products     Asthma or hives or rash Cannot take Flu vaccine due to egg allergy   Hydrocodone-Acetaminophen Other (See Comments)    unknown   Levofloxacin Other (See Comments)    unknown   Penicillins Hives and Swelling    Childhood    Physical Exam Today's Vitals   05/10/23 1230  BP: 111/65  Pulse: (!) 49  Weight: 175 lb (79.4 kg)  Height: 5\' 10"  (1.778 m)    Body mass index is 25.11 kg/m.    General: well developed, well nourished very pleasant middle-aged Caucasian male, seated, in no evident distress Head: head normocephalic and atraumatic.   Neck: supple with no carotid or supraclavicular bruits Cardiovascular: regular rate and rhythm, no murmurs Musculoskeletal: no deformity Skin:  no rash/petichiae Vascular:  Normal pulses all extremities  Neurologic Exam Mental Status: Awake and fully alert. Oriented to place and time. Recent and remote memory intact. Attention span, concentration and fund of knowledge appropriate. Mood and affect appropriate.  Cranial Nerves: Pupils equal, briskly reactive to light. Extraocular movements full without nystagmus. Visual fields full show right partial superior quadrantanopsia to confrontation. Hearing intact. Facial sensation intact. Face, tongue, palate moves normally and symmetrically.  Motor: Normal bulk and tone. Normal strength in all  tested extremity muscles. Sensory.: intact to touch , pinprick , position and vibratory sensation.  Coordination: Rapid alternating movements normal in all extremities. Finger-to-nose and heel-to-shin performed accurately bilaterally. Gait and Station: Arises from chair without difficulty. Stance is normal. Gait demonstrates normal stride length and balance without  use of assistive device. Able to heel, toe and tandem walk without difficulty.  Reflexes: 1+ and symmetric. Toes downgoing.        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 No significant vascular risk factors except borderline hyperlipidemia and PFO for which he has undergone successful endovascular PFO closure on 07/16/2020.  Remote history of seizures 1st in 2004 and subsequent recurrence in 2016      PLAN:  1.  Left thalamic infarct -Continue aspirin 81 mg and atorvastatin 40 mg daily for secondary stroke prevention and  -Close PCP follow-up to maintain strict control of HLD with LDL cholesterol goal below 70 mg/dL.  -Lipid panel 05/2022 LDL 51, plans on repeat lab work with PCP next month  2.  Seizure disorder -Continue Keppra XR 500 mg daily for seizure prophylaxis -refill provided -weaned off Dilantin in 2022 as no longer needed  3.  PFO -s/p closure 07/16/2020 by Dr. Excell Seltzer -Repeat 2D echo 05/2021 no residual right to left shunting -Plans to follow-up with Dr. Excell Seltzer in 09/2024     Follow-up in 1 year or call earlier if needed     CC:  Richmond Campbell., PA-C   I spent 25 minutes of face-to-face and non-face-to-face time with patient.  This included previsit chart review, lab review, study review, order entry, electronic health record documentation, patient education and discussion regarding above diagnoses and treatment plan and answered all other questions to patient's  satisfaction  Ihor Austin, [facility]  [facility] [address] [place], [place] [address]  Phone [phone] Fax [phone] Note: This document was prepared with digital dictation and possible smart phrase technology. Any transcriptional errors that result from this process are unintentional.

## 2023-05-10 ENCOUNTER — Encounter: Payer: Self-pay | Admitting: Adult Health

## 2023-05-10 ENCOUNTER — Ambulatory Visit: Payer: BC Managed Care – PPO | Admitting: Adult Health

## 2023-05-10 VITALS — BP 111/65 | HR 49 | Ht 70.0 in | Wt 175.0 lb

## 2023-05-10 DIAGNOSIS — I6381 Other cerebral infarction due to occlusion or stenosis of small artery: Secondary | ICD-10-CM

## 2023-05-10 DIAGNOSIS — G40209 Localization-related (focal) (partial) symptomatic epilepsy and epileptic syndromes with complex partial seizures, not intractable, without status epilepticus: Secondary | ICD-10-CM

## 2023-05-10 MED ORDER — LEVETIRACETAM ER 500 MG PO TB24: 500.0000 mg | ORAL_TABLET | Freq: Every day | ORAL | 3 refills | Status: DC

## 2023-05-10 NOTE — Patient Instructions (Signed)
Your Plan:  Continue Keppra XR 500mg  daily for seizure prevention  Continue aspirin and atorvastatin for secondary stroke prevention  Continue to follow with PCP for stroke risk factor management    Follow-up in 1 year or call earlier if needed     Thank you for coming to see [place] at [facility]. I hope we have been able to provide you high quality care today.  You may receive a patient satisfaction survey over the next few weeks. We would appreciate your feedback and comments so that we may continue to improve ourselves and the health of our patients.

## 2023-07-25 ENCOUNTER — Other Ambulatory Visit: Payer: Self-pay | Admitting: Adult Health

## 2024-01-18 ENCOUNTER — Other Ambulatory Visit: Payer: Self-pay | Admitting: Adult Health

## 2024-03-19 ENCOUNTER — Telehealth: Payer: Self-pay | Admitting: Adult Health

## 2024-03-19 NOTE — Telephone Encounter (Signed)
 Appointment details confirmed

## 2024-03-26 ENCOUNTER — Other Ambulatory Visit: Payer: Self-pay | Admitting: Adult Health

## 2024-05-08 NOTE — Progress Notes (Unsigned)
 [facility] [address] [place]. [place] [address] [phone]       OFFICE FOLLOW UP VISIT NOTE  [name] Date of Birth:  April 28, 1976 Medical Record Number:  [id number]   Referring MD: Josette Aho, PA-C  Reason for Referral: Stroke and seizure   No chief complaint on file.    HPI:  Update 05/09/2024 JM: Patient returns for yearly stroke and seizure follow-up.  Has been stable over the past year.  Remains on Keppra  and denies any recurrent seizure activity.  Denies side effects.  Denies any new stroke/TIA symptoms.  Poststroke vision loss stable.  Has follow-up with PCP next month with plans on repeat lab work.  Plans on follow-up with Dr. Wonda in beginning of 2026.      History provided for reference purposes only Update 05/10/2023 JM: Patient returns for yearly stroke and seizure follow-up.  Stable since prior prior visit.  Denies new stroke/TIA symptoms, continued right sided vision loss, stable.  Denies any seizure activity on Keppra  XR 500mg  daily.  Compliant on medications.  Routinely follows with PCP for stroke risk factor management has f/u visit end of this month with plans on repeat lab work.  Continues to be followed by cardiology Dr. Wonda.  Update 04/12/2022 JM: Patient returns for 1 year stroke and seizure follow-up.  Overall stable since prior visit.  Denies any new stroke/TIA symptoms.  Residual right-sided visual loss stable, typically does not interfere with daily activity.  Denies any recent seizure activity, compliant on Keppra  XR 500 mg daily, denies side effects.  Compliant on aspirin  and atorvastatin .  Blood pressure today 112/70.  Had follow-up with cardiologist Dr. Wonda 05/2021 with 2D echo showing no residual right to left shunting and plans on follow-up next month.  Has follow-up with PCP next month.  No new concerns at this time.  Update 04/13/2021 JM: Mr. Jarmon returns for 47-month stroke and seizure follow-up unaccompanied.   Overall stable since prior visit.  Denies new stroke/TIA symptoms.  Residual right upper visual impairment - stable without worsening. Typically not noticeable deficit but at times can be noticeable such as when he is riding his mountain bike.  Compliant on aspirin  and atorvastatin  -denies associated side effects.  Blood pressure today 118/69.  TCD 10/2020 showed small residual right to left shunt post endovascular closure in 07/2020 but unlikely to be of any clinical significance.  Remains on Keppra  XR without any recent seizure activity.  He has since weaned off from Dilantin  without difficulty.  No new concerns at this time.  Update 10/14/2020 Dr. Rosemarie: He returns for follow-up after last visit 3 months ago.  He had lab work done at last visit which showed LDL cholesterol of 109 mg percent hemoglobin A1c of 4.9.  He has been started on low-fat diet and plan is to repeat follow-up lipid profile today and start statin if LDL still suboptimal.  He transcranial Doppler bubble study done as an outpatient on 06/04/2020 which showed medium size right-to-left shunt.  This was subsequently confirmed on TEE on 07/01/2020 which also showed atrial septal aneurysm.  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  Patient apparently had no clinical symptomatology from this.  MRI of the brain and neck showed no significant large vessel stenosis or occlusion.  He was referred to Dr. Wonda did successful endovascular PFO closure on 07/16/2020.  Procedure was complicated by retroperitoneal hematoma which patient has improved.  He is on dual antiplatelet  therapy aspirin  Plavix  and tolerating it well without bruising or bleeding.  States his blood pressure is under good control and today it is 130/68.  He has not had any seizures now for last 6 years but remains on Dilantin  300 mg extended release daily and Keppra  Exar 500 mg daily.  Working full-time and has no complaints.  Inital visit 05/28/2020  Dr. Rosemarie:  Mr. Orsak is a 48 year old Caucasian male seen today for consultation visit for new onset headache and vision difficulties.  History is obtained from the patient, review of electronic medical records and I personally reviewed available pertinent imaging films in PACS.  He has past medical history of hypertension, seizures since 2016 well-controlled on Dilantin  and Keppra  and history of asthma.  He states that to the end of July 2020 when he had an episode of severe bad headache the morning he vomited twice and had to rest all day.  The headache he describes as moderate 7/10 in severity mostly aching but all over.  He denies any light or sound sensitivity.  There were no associated symptoms of blurred vision or focal neurological symptoms.  He rested the whole day.  Next day the headache was better and then gradually resolved.  He however started noticing visual flashes in the right upper quadrant of field of vision.  This lasted several days and subsequently has noticed some loss of vision in this area which has persisted.  He has not had any follow-up brain imaging done.  He denies any prior history of migraines but remembers one episode of somewhat similar headache he had in 2001 which was less severe and he threw up and that lasted a day but was not accompanied by visual symptoms.  He was able to work on that day and not have to lie down.  He does have family history of migraine is in his sister who has 1 or 2 such headaches a month and has some visual symptoms with it.  Patient noticed that after this current episode every time he had caffeine the visual flashes would be triggered so he cut back caffeine they stopped.  He has a prior history of 2 episodes of seizures in August 2016 which occurred back-to-back few days apart.  He may have had previous seizures in February, March and April that year which had not been recognized.  He had 1 seizure in 2004 and saw Dr. Jenel.  He was on Dilantin  for a  while which was stopped.  Dilantin  was restarted following his seizures in 2016. MRI scan of the brain and EEG done in the office in September 2016 were both normal except for tiny to nonspecific white matter hyperintensities of unclear significance.  He has also remained on Keppra  XR 500mg  daily in addition to Dilantin  ER 300 mg daily and tolerating both medications well without side effects.  His last Dilantin  level on 10/01/2019 was 8.2.  CBC and BMP done on 04/01/2020 were both unremarkable.  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, occupational   Tobacco Use   Smoking status: Never   Smokeless tobacco: Never  Substance and Sexual Activity   Alcohol use: No   Drug use: Yes    Types: Marijuana   Sexual activity: Never  Other Topics Concern   Not on file  Social History Narrative   Lives alone in a home    Drinks 1-3 cups caffeine daily   Right Handed   Social Drivers of Health   Financial Resource Strain: Not on file  Food Insecurity: Not on file  Transportation Needs: Not on file  Physical Activity: Not on file  Stress: Not on file  Social Connections: Not on file  Intimate Partner Violence: Not on file    Medications:   Current Outpatient Medications on File Prior to Visit  Medication Sig Dispense Refill   albuterol (VENTOLIN HFA) 108 (90 Base) MCG/ACT inhaler Inhale 2 puffs into the lungs every 6 (six) hours as needed for wheezing or shortness of breath.      aspirin  EC 81 MG tablet Take 81 mg by mouth daily. Swallow whole.      atorvastatin  (LIPITOR) 40 MG tablet TAKE ONE TABLET ONCE DAILY 30 tablet 1   fluticasone (FLONASE) 50 MCG/ACT nasal spray Place 1 spray into the nose daily.     fluticasone (FLOVENT HFA) 44 MCG/ACT inhaler Inhale 2 puffs into the lungs daily.      levETIRAcetam  (KEPPRA  XR) 500 MG 24 hr tablet Take 1 tablet (500 mg total) by mouth daily. 90 tablet 3   loratadine (CLARITIN) 10 MG tablet Take 10 mg by mouth daily.      losartan (COZAAR) 50 MG tablet Take 50 mg by mouth daily.     Multiple Vitamin (MULTIVITAMIN WITH MINERALS) TABS tablet Take 1 tablet by mouth daily.     pantoprazole (PROTONIX) 40 MG tablet Take 40 mg by mouth daily.  3   Pediatric Multiple Vitamins (FLINTSTONES PLUS EXTRA C) CHEW Chew 1 tablet by mouth daily.     No current facility-administered medications on file prior to visit.    Allergies:   Allergies  Allergen Reactions   Egg-Derived Products     Asthma or hives or rash Cannot take Flu vaccine due to egg allergy   Hydrocodone-Acetaminophen Other (See Comments)    unknown   Levofloxacin Other (See Comments)    unknown   Penicillins Hives and Swelling    Childhood    Physical Exam There were no vitals filed for this visit.   There is no height or weight on file to calculate BMI.    General: well developed, well nourished very pleasant middle-aged Caucasian male, seated, in no evident distress Head: head normocephalic and atraumatic.   Neck: supple with no carotid or supraclavicular bruits Cardiovascular: regular rate and rhythm, no murmurs Musculoskeletal: no deformity Skin:  no rash/petichiae Vascular:  Normal pulses all extremities  Neurologic Exam Mental Status: Awake and fully alert. Oriented to place and time. Recent and remote memory intact. Attention span, concentration and fund of knowledge appropriate. Mood and affect appropriate.  Cranial Nerves: Pupils equal, briskly reactive to light. Extraocular movements full without nystagmus. Visual  fields full show right partial superior quadrantanopsia to confrontation. Hearing intact. Facial sensation intact. Face, tongue, palate moves normally and symmetrically.  Motor: Normal bulk and tone. Normal strength in all tested extremity muscles. Sensory.: intact to touch , pinprick , position and vibratory sensation.  Coordination: Rapid alternating movements normal in all extremities. Finger-to-nose and heel-to-shin performed accurately bilaterally. Gait and Station: Arises from chair without difficulty. Stance is normal. Gait demonstrates normal stride length and balance without use of assistive device. Able to heel, toe and tandem walk without difficulty.  Reflexes: 1+ and symmetric. Toes downgoing.        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 No significant vascular risk factors except borderline hyperlipidemia and PFO for which he has undergone successful endovascular PFO closure on 07/16/2020.  Remote history of seizures 1st in 2004 and subsequent recurrence in 2016      PLAN:  1.  Left thalamic infarct -Continue aspirin  81 mg and atorvastatin  40 mg daily for secondary stroke prevention and  -Close PCP follow-up to maintain strict control of HLD with LDL cholesterol goal below 70 mg/dL.  -Lipid panel 05/2022 LDL 51, plans on repeat lab work with PCP next month  2.  Seizure disorder -Continue Keppra  XR 500 mg daily for seizure prophylaxis -refill provided -weaned off Dilantin  in 2022 as no longer needed  3.  PFO -s/p closure 07/16/2020 by Dr. Wonda -Repeat 2D echo 05/2021 no residual right to left shunting -Plans to follow-up with Dr. Wonda in 09/2024     Follow-up in 1 year or call earlier if needed     CC:  Debrah Josette ORN., PA-C   I personally spent a total of *** minutes in the care of the patient today including {Time Based  Coding:210964241}.   Harlene Bogaert, AGNP-BC  [facility] [address] [place], [place] [address]  Phone [phone] Fax [phone] Note: This document was prepared with digital dictation and possible smart phrase technology. Any transcriptional errors that result from this process are unintentional.

## 2024-05-09 ENCOUNTER — Encounter: Payer: Self-pay | Admitting: Adult Health

## 2024-05-09 ENCOUNTER — Ambulatory Visit (INDEPENDENT_AMBULATORY_CARE_PROVIDER_SITE_OTHER): Payer: BC Managed Care – PPO | Admitting: Adult Health

## 2024-05-09 VITALS — BP 118/80 | HR 64 | Ht 70.0 in | Wt 170.0 lb

## 2024-05-09 DIAGNOSIS — G40209 Localization-related (focal) (partial) symptomatic epilepsy and epileptic syndromes with complex partial seizures, not intractable, without status epilepticus: Secondary | ICD-10-CM

## 2024-05-09 DIAGNOSIS — I6381 Other cerebral infarction due to occlusion or stenosis of small artery: Secondary | ICD-10-CM

## 2024-05-09 MED ORDER — LEVETIRACETAM ER 500 MG PO TB24: 500.0000 mg | ORAL_TABLET | Freq: Every day | ORAL | 3 refills | Status: AC

## 2024-05-09 NOTE — Patient Instructions (Addendum)
 Your Plan:  Continue Keppra  XR 500mg  daily for seizure prevention -please call with any recurrent seizure activity  Continue aspirin  and atorvastatin  for secondary stroke prevention and continue routine follow-up with PCP and cardiology for stroke risk factor management     Follow-up in 1 year or call earlier if needed     Thank you for coming to see us  at [facility]. I hope we have been able to provide you high quality care today.  You may receive a patient satisfaction survey over the next few weeks. We would appreciate your feedback and comments so that we may continue to improve ourselves and the health of our patients.

## 2024-05-22 ENCOUNTER — Other Ambulatory Visit: Payer: Self-pay | Admitting: Adult Health

## 2024-07-26 ENCOUNTER — Other Ambulatory Visit: Payer: Self-pay | Admitting: Adult Health

## 2024-09-17 ENCOUNTER — Encounter: Payer: Self-pay | Admitting: Cardiovascular Disease

## 2025-05-14 ENCOUNTER — Ambulatory Visit: Admitting: Adult Health
